# Patient Record
Sex: Female | Born: 1950
Health system: Southern US, Community
[De-identification: ages and names within clinical notes are randomized; demographics above are authoritative.]

## PROBLEM LIST (undated history)

## (undated) DIAGNOSIS — I10 Essential (primary) hypertension: Secondary | ICD-10-CM

## (undated) HISTORY — PX: GALLBLADDER SURGERY: SHX652

## (undated) HISTORY — PX: BREAST EXCISIONAL BIOPSY: SUR124

## (undated) HISTORY — DX: Essential (primary) hypertension: I10

## (undated) HISTORY — PX: HERNIA REPAIR: SHX51

## (undated) HISTORY — PX: ABDOMINAL HYSTERECTOMY: SHX81

## (undated) HISTORY — PX: TUMOR REMOVAL: SHX12

---

## 2000-03-31 ENCOUNTER — Other Ambulatory Visit: Admission: RE | Admit: 2000-03-31 | Discharge: 2000-03-31 | Payer: Self-pay | Admitting: Gynecology

## 2002-08-29 ENCOUNTER — Other Ambulatory Visit: Admission: RE | Admit: 2002-08-29 | Discharge: 2002-08-29 | Payer: Self-pay | Admitting: Family Medicine

## 2002-11-03 ENCOUNTER — Encounter: Payer: Self-pay | Admitting: Gastroenterology

## 2002-11-03 ENCOUNTER — Emergency Department (HOSPITAL_COMMUNITY): Admission: EM | Admit: 2002-11-03 | Discharge: 2002-11-03 | Payer: Self-pay | Admitting: Emergency Medicine

## 2002-11-03 ENCOUNTER — Ambulatory Visit (HOSPITAL_COMMUNITY): Admission: RE | Admit: 2002-11-03 | Discharge: 2002-11-03 | Payer: Self-pay | Admitting: Family Medicine

## 2002-11-08 ENCOUNTER — Encounter (INDEPENDENT_AMBULATORY_CARE_PROVIDER_SITE_OTHER): Payer: Self-pay | Admitting: *Deleted

## 2002-11-08 ENCOUNTER — Observation Stay (HOSPITAL_COMMUNITY): Admission: RE | Admit: 2002-11-08 | Discharge: 2002-11-09 | Payer: Self-pay | Admitting: *Deleted

## 2004-04-29 ENCOUNTER — Encounter: Admission: RE | Admit: 2004-04-29 | Discharge: 2004-04-29 | Payer: Self-pay | Admitting: Family Medicine

## 2004-05-23 ENCOUNTER — Encounter: Admission: RE | Admit: 2004-05-23 | Discharge: 2004-05-23 | Payer: Self-pay | Admitting: Family Medicine

## 2004-05-23 ENCOUNTER — Encounter (INDEPENDENT_AMBULATORY_CARE_PROVIDER_SITE_OTHER): Payer: Self-pay | Admitting: *Deleted

## 2004-06-05 ENCOUNTER — Other Ambulatory Visit: Admission: RE | Admit: 2004-06-05 | Discharge: 2004-06-05 | Payer: Self-pay | Admitting: Family Medicine

## 2004-08-05 ENCOUNTER — Ambulatory Visit (HOSPITAL_COMMUNITY): Admission: RE | Admit: 2004-08-05 | Discharge: 2004-08-05 | Payer: Self-pay | Admitting: Gastroenterology

## 2004-10-19 ENCOUNTER — Emergency Department (HOSPITAL_COMMUNITY): Admission: EM | Admit: 2004-10-19 | Discharge: 2004-10-19 | Payer: Self-pay

## 2005-01-26 ENCOUNTER — Encounter: Admission: RE | Admit: 2005-01-26 | Discharge: 2005-01-26 | Payer: Self-pay | Admitting: Family Medicine

## 2005-05-28 ENCOUNTER — Encounter: Admission: RE | Admit: 2005-05-28 | Discharge: 2005-05-28 | Payer: Self-pay | Admitting: Family Medicine

## 2005-06-04 ENCOUNTER — Encounter: Admission: RE | Admit: 2005-06-04 | Discharge: 2005-06-04 | Payer: Self-pay | Admitting: Family Medicine

## 2005-06-12 ENCOUNTER — Other Ambulatory Visit: Admission: RE | Admit: 2005-06-12 | Discharge: 2005-06-12 | Payer: Self-pay | Admitting: Family Medicine

## 2005-06-13 ENCOUNTER — Emergency Department (HOSPITAL_COMMUNITY): Admission: EM | Admit: 2005-06-13 | Discharge: 2005-06-13 | Payer: Self-pay | Admitting: Emergency Medicine

## 2005-12-25 ENCOUNTER — Encounter: Admission: RE | Admit: 2005-12-25 | Discharge: 2005-12-25 | Payer: Self-pay | Admitting: Family Medicine

## 2006-04-22 ENCOUNTER — Encounter: Admission: RE | Admit: 2006-04-22 | Discharge: 2006-04-22 | Payer: Self-pay | Admitting: Family Medicine

## 2006-05-11 ENCOUNTER — Ambulatory Visit (HOSPITAL_COMMUNITY): Admission: RE | Admit: 2006-05-11 | Discharge: 2006-05-11 | Payer: Self-pay | Admitting: *Deleted

## 2006-05-11 ENCOUNTER — Encounter (INDEPENDENT_AMBULATORY_CARE_PROVIDER_SITE_OTHER): Payer: Self-pay | Admitting: Specialist

## 2006-06-18 ENCOUNTER — Emergency Department (HOSPITAL_COMMUNITY): Admission: EM | Admit: 2006-06-18 | Discharge: 2006-06-18 | Payer: Self-pay | Admitting: Emergency Medicine

## 2006-06-22 ENCOUNTER — Ambulatory Visit (HOSPITAL_COMMUNITY): Admission: RE | Admit: 2006-06-22 | Discharge: 2006-06-22 | Payer: Self-pay | Admitting: Family Medicine

## 2006-06-25 ENCOUNTER — Ambulatory Visit (HOSPITAL_COMMUNITY): Admission: RE | Admit: 2006-06-25 | Discharge: 2006-06-25 | Payer: Self-pay | Admitting: Family Medicine

## 2006-06-29 ENCOUNTER — Ambulatory Visit (HOSPITAL_COMMUNITY): Admission: RE | Admit: 2006-06-29 | Discharge: 2006-06-29 | Payer: Self-pay | Admitting: Gastroenterology

## 2006-06-30 ENCOUNTER — Ambulatory Visit (HOSPITAL_COMMUNITY): Admission: RE | Admit: 2006-06-30 | Discharge: 2006-06-30 | Payer: Self-pay | Admitting: Gastroenterology

## 2007-01-27 ENCOUNTER — Encounter: Admission: RE | Admit: 2007-01-27 | Discharge: 2007-01-27 | Payer: Self-pay | Admitting: Family Medicine

## 2008-05-22 ENCOUNTER — Encounter: Admission: RE | Admit: 2008-05-22 | Discharge: 2008-05-22 | Payer: Self-pay | Admitting: Family Medicine

## 2009-05-24 ENCOUNTER — Encounter: Admission: RE | Admit: 2009-05-24 | Discharge: 2009-05-24 | Payer: Self-pay | Admitting: Family Medicine

## 2010-11-30 ENCOUNTER — Encounter: Payer: Self-pay | Admitting: Family Medicine

## 2011-03-27 NOTE — Op Note (Signed)
NAME:  BRIGET, SHAHEED                         ACCOUNT NO.:  [id number]   MEDICAL RECORD NO.:  [id number]                   PATIENT TYPE:  OBV   LOCATION:  0480                                 FACILITY:  [facility]   PHYSICIAN:  Vikki Ports, M.D.         DATE OF BIRTH:  1951-07-04   DATE OF PROCEDURE:  11/08/2002  DATE OF DISCHARGE:  11/09/2002                                 OPERATIVE REPORT   PREOPERATIVE DIAGNOSIS:  Symptomatic cholelithiasis.   POSTOPERATIVE DIAGNOSIS:  Symptomatic cholelithiasis with normal  cholangiogram.   PROCEDURE:  Laparoscopic cholecystectomy with intraoperative cholangiogram.   SURGEON:  Vikki Ports, M.D.   ANESTHESIA:  General.   DESCRIPTION OF PROCEDURE:  The patient was taken to the operating room and  placed in the supine position.  After adequate general anesthesia was  induced using the endotracheal tube, the abdomen was prepped and draped in  the normal sterile fashion.  Using a transverse infraumbilical incision, I  dissected down to the fascia.  The fascia was opened vertically.  An O  Vicryl pursestring suture was placed around the fascial defect.  The Hasson  trocar was introduced into the abdomen and using continuous flow carbon  dioxide, the abdomen was insufflated to a pressure of 15 mmHg.  Under direct  visualization, a 10 mm port was placed in the subxiphoid region, and two 5  mm ports were placed in the right abdomen.  The gallbladder was identified  and retracted cephalad.  The gallbladder was very, very edematous and was  difficult to grasp and therefore a cyst aspirator was introduced, and the  gallbladder was aspirated.  The infundibulum was identified and a tubular  structure from the infundibulum was identified and felt to be the cystic  duct.  Good window was created in all directions, and the duct was clipped  proximally at the gallbladder.  There appeared to be a very short cystic  duct and cholangiogram  showed a normal filling of the duodenum and hepatic  ducts and again a very short cystic duct.  The cholangiocatheter was  removed, and the duct was clipped distally with two clips and divided.  The  cystic artery was then identified, triply clipped and divided.  The  gallbladder was taken off the gallbladder bed using Bovie.  This was a  difficult dissection secondary to the extensive inflammation.  The  gallbladder was then placed in the EndoCatch bag and removed through the  umbilical port.  Adequate hemostasis was ensured, and the skin was closed  with a subcuticular 4-0 Monocryl after all trocars had been removed.  The  fascial defect had been closed with an 0 Vicryl pursestring suture.  Vikki Ports, M.D.    KRH/MEDQ  D:  11/12/2002  T:  11/12/2002  Job:  [id number]

## 2011-03-27 NOTE — Op Note (Signed)
Krista Gonzales, Krista Gonzales               ACCOUNT NO.:  [id number]   MEDICAL RECORD NO.:  [id number]          PATIENT TYPE:  AMB   LOCATION:  ENDO                         FACILITY:  [facility]   PHYSICIAN:  John C. Madilyn Fireman, M.D.    DATE OF BIRTH:  11/23/1950   DATE OF PROCEDURE:  08/05/2004  DATE OF DISCHARGE:                                 OPERATIVE REPORT   PROCEDURE:  Colonoscopy.   INDICATIONS FOR PROCEDURE:  Family history of colon cancer in a first-degree  relative.   PROCEDURE:  The patient was placed in the left lateral decubitus position  and placed on the pulse monitor with continuous low flow oxygen delivered by  nasal cannula.  She was sedated with 75 mcg IV fentanyl and 7 mg IV Versed.  The Olympus video colonoscope is inserted into the rectum and advanced to  the cecum, confirmed by transillumination at McBurney's point and  visualization of the ileocecal valve and appendiceal orifice.  Prep is  excellent.  The cecum, ascending, transverse, descending, and sigmoid colon  all appeared normal with no masses, polyps, diverticula, or other mucosal  abnormalities.  The rectum likewise appeared normal, and retroflexed view of  the anus revealed no obvious internal hemorrhoids.  The scope was then  withdrawn, and the patient returned to the recovery room in stable  condition.  She tolerated the procedure well, and there were no immediate  complications.   IMPRESSION:  Normal colonoscopy.   PLAN:  Repeat study in five years.      JCH/MEDQ  D:  08/05/2004  T:  08/05/2004  Job:  [id number]   cc:   Magnus Sinning. Dimple Casey, M.D.  [address] [place]  [place] [address]  Fax: [phone]

## 2011-03-27 NOTE — Op Note (Signed)
Krista Gonzales, Krista Gonzales               ACCOUNT NO.:  [id number]   MEDICAL RECORD NO.:  [id number]          PATIENT TYPE:  AMB   LOCATION:  SDS                          FACILITY:  [facility]   PHYSICIAN:  Alfonse Ras, MD   DATE OF BIRTH:  1951/01/15   DATE OF PROCEDURE:  05/11/2006  DATE OF DISCHARGE:                                 OPERATIVE REPORT   PREOPERATIVE DIAGNOSES:  1.  Right breast mass.  2.  Umbilical hernia.   POSTOPERATIVE DIAGNOSES:  1.  Right breast mass.  2.  Umbilical hernia.   PROCEDURE:  1.  Excisional right breast biopsy.  2.  Umbilical hernia repair.   ANESTHESIA:  General laryngeal mask.   SURGEON:  Baruch Merl, M.D.   DESCRIPTION:  The patient was taken operating room and after general  anesthesia was induced using laryngeal mask, the abdomen and right breast  were prepped and draped normal sterile fashion.  Using a transverse  previously placed incision infraumbilically, I dissected down onto a hernia  sac.  Omental contents were reduced into the abdominal cavity.  Fascial  defect was only about 1.5 cm and was closed primarily with figure-of-eight 0  Novofil sutures.  The skin was closed with staples.  Tissues were injected  with Marcaine.   Then turned my attention to the right breast.  A transverse incision was  made over 12 o'clock region of the right breast over the palpable mass.  I  dissected down through fatty breast tissue onto a multilobulated mass.  This  was grasped with a 2-0 Vicryl suture and delivered up off the pectoralis  fascia in its entirety.  It was sent for pathologic evaluation.  The skin  was closed with subcuticular 3-0 Monocryl.  Steri-Strips and dressings were  applied.  The patient tolerated the procedure well, went to PACU in good  condition.      Alfonse Ras, MD  Electronically Signed     KRE/MEDQ  D:  05/11/2006  T:  05/11/2006  Job:  [id number]

## 2012-02-24 ENCOUNTER — Other Ambulatory Visit: Payer: Self-pay | Admitting: Family Medicine

## 2012-02-24 DIAGNOSIS — Z1231 Encounter for screening mammogram for malignant neoplasm of breast: Secondary | ICD-10-CM

## 2012-03-29 ENCOUNTER — Ambulatory Visit (HOSPITAL_COMMUNITY)
Admission: RE | Admit: 2012-03-29 | Discharge: 2012-03-29 | Disposition: A | Discharge status: Home / Self Care | Payer: Self-pay | Source: Ambulatory Visit | Attending: Family Medicine | Admitting: Family Medicine

## 2012-03-29 DIAGNOSIS — Z1231 Encounter for screening mammogram for malignant neoplasm of breast: Secondary | ICD-10-CM

## 2013-05-20 ENCOUNTER — Other Ambulatory Visit: Payer: Self-pay | Admitting: *Deleted

## 2013-05-20 ENCOUNTER — Other Ambulatory Visit: Payer: Self-pay | Admitting: Nurse Practitioner

## 2013-05-20 MED ORDER — LISINOPRIL 20 MG PO TABS: 20.0000 mg | ORAL_TABLET | Freq: Every day | ORAL | Status: DC

## 2013-05-20 NOTE — Telephone Encounter (Signed)
Patient aware rx sent to pharmacy.  

## 2013-07-24 ENCOUNTER — Telehealth: Payer: Self-pay | Admitting: Nurse Practitioner

## 2013-07-24 ENCOUNTER — Encounter: Payer: Self-pay | Admitting: Family Medicine

## 2013-07-24 ENCOUNTER — Ambulatory Visit (INDEPENDENT_AMBULATORY_CARE_PROVIDER_SITE_OTHER): Payer: BC Managed Care – PPO | Admitting: Family Medicine

## 2013-07-24 VITALS — BP 171/94 | HR 70 | Temp 98.0°F | Ht 66.0 in | Wt 185.2 lb

## 2013-07-24 DIAGNOSIS — B370 Candidal stomatitis: Secondary | ICD-10-CM

## 2013-07-24 DIAGNOSIS — I10 Essential (primary) hypertension: Secondary | ICD-10-CM | POA: Insufficient documentation

## 2013-07-24 LAB — POCT CBC
Granulocyte percent: 64.2 %G (ref 37–80)
HCT, POC: 39 % (ref 37.7–47.9)
Hemoglobin: 12.8 g/dL (ref 12.2–16.2)
Lymph, poc: 2 (ref 0.6–3.4)
MCH, POC: 27.7 pg (ref 27–31.2)
MCHC: 32.7 g/dL (ref 31.8–35.4)
MCV: 84.7 fL (ref 80–97)
MPV: 7.6 fL (ref 0–99.8)
POC Granulocyte: 4.4 (ref 2–6.9)
POC LYMPH PERCENT: 29.2 %L (ref 10–50)
Platelet Count, POC: 250 10*3/uL (ref 142–424)
RBC: 4.6 M/uL (ref 4.04–5.48)
RDW, POC: 13.9 %
WBC: 6.8 10*3/uL (ref 4.6–10.2)

## 2013-07-24 MED ORDER — AMLODIPINE BESYLATE 5 MG PO TABS: 5.0000 mg | ORAL_TABLET | Freq: Every day | ORAL | Status: DC

## 2013-07-24 MED ORDER — CLOTRIMAZOLE 10 MG MT TROC: 10.0000 mg | Freq: Every day | OROMUCOSAL | Status: DC

## 2013-07-24 NOTE — Telephone Encounter (Signed)
Mouth is covered in yellow/white coating and she has lost her sense of taste. Noticed it about 2 days ago. Appt scheduled for this afternoon.  Patient aware.

## 2013-07-24 NOTE — Progress Notes (Signed)
Patient ID: Krista Gonzales, female   DOB: [birth year], 62 y.o.   MRN: [id number] SUBJECTIVE: CC: Chief Complaint  Patient presents with  . Acute Visit    thrush  and c/o headache     HPI: Patient is here for follow up of hypertension: denies Headache;deniesChest Pain;denies weakness;denies Shortness of Breath or Orthopnea;denies Visual changes;denies palpitations;denies cough;denies pedal edema;denies symptoms of TIA or stroke; admits to Compliance with medications. denies Problems with medications. BP is elevated and she is having headaches.no neurologic deficit.  Thrush in the mouth. Has dentures in. Tongue sore but the white irritated areas are in the cheeks.   No past medical history on file. No past surgical history on file. History   Social History  . Marital Status: Married    Spouse Name: N/A    Number of Children: N/A  . Years of Education: N/A   Occupational History  . Not on file.   Social History Main Topics  . Smoking status: Never Smoker   . Smokeless tobacco: Not on file  . Alcohol Use: Not on file  . Drug Use: Not on file  . Sexual Activity: Not on file   Other Topics Concern  . Not on file   Social History Narrative  . No narrative on file   No family history on file. Current Outpatient Prescriptions on File Prior to Visit  Medication Sig Dispense Refill  . lisinopril (PRINIVIL,ZESTRIL) 20 MG tablet Take 1 tablet (20 mg total) by mouth daily.  90 tablet  3   No current facility-administered medications on file prior to visit.   No Known Allergies  There is no immunization history on file for this patient. Prior to Admission medications   Medication Sig Start Date End Date Taking? Authorizing Provider  lisinopril (PRINIVIL,ZESTRIL) 20 MG tablet Take 1 tablet (20 mg total) by mouth daily. 05/20/13  Yes Mary-Margaret Daphine Deutscher, FNP     ROS: As above in the HPI. All other systems are stable or negative.  OBJECTIVE: APPEARANCE:  Patient in no  acute distress.The patient appeared well nourished and normally developed. Acyanotic. Waist: VITAL SIGNS:BP 171/94  Pulse 70  Temp(Src) 98 F (36.7 C) (Oral)  Ht 5\' 6"  (1.676 m)  Wt 185 lb 3.2 oz (84.006 kg)  BMI 29.91 kg/m2 WF  SKIN: warm and  Dry without overt rashes, tattoos and scars  HEAD and Neck: without JVD, Head and scalp: normal Eyes:No scleral icterus. Fundi normal, eye movements normal. Ears: Auricle normal, canal normal, Tympanic membranes normal, insufflation normal. Nose: normal Throat: normal Oral cavity. Upper denture. Has white thick curdlike materila in the cheeks adjacent to the dentures with inflammation of the tongue and the tongue is coated. Neck & thyroid: normal  CHEST & LUNGS: Chest wall: normal Lungs: Clear  CVS: Reveals the PMI to be normally located. Regular rhythm, First and Second Heart sounds are normal,  absence of murmurs, rubs or gallops. Peripheral vasculature: Radial pulses: normal Dorsal pedis pulses: normal Posterior pulses: normal  ABDOMEN:  Appearance: normal Benign, no organomegaly, no masses, no Abdominal Aortic enlargement. No Guarding , no rebound. No Bruits. Bowel sounds: normal  RECTAL: N/A GU: N/A  EXTREMETIES: nonedematous.  MUSCULOSKELETAL:  Spine: normal Joints: intact  NEUROLOGIC: oriented to time,place and person; nonfocal. Strength is normal Sensory is normal Reflexes are normal Cranial Nerves are normal.  ASSESSMENT: Hypertension - Plan: amLODipine (NORVASC) 5 MG tablet, CMP14+EGFR  Thrush, oral - Plan: clotrimazole (MYCELEX) 10 MG troche, POCT CBC   PLAN: Orders  Placed This Encounter  Procedures  . CMP14+EGFR  . POCT CBC    Meds ordered this encounter  Medications  . amLODipine (NORVASC) 5 MG tablet    Sig: Take 1 tablet (5 mg total) by mouth daily.    Dispense:  30 tablet    Refill:  3  . clotrimazole (MYCELEX) 10 MG troche    Sig: Take 1 tablet (10 mg total) by mouth 5 (five) times  daily.    Dispense:  70 tablet    Refill:  0    Low salt diet.  Return in about 2 weeks (around 08/07/2013) for recheck BP, Recheck medical problems. Or before if any acute changes.  Tyera Hansley P. [place]esto Charon, M.D.

## 2013-07-26 LAB — CMP14+EGFR
ALT: 10 IU/L (ref 0–32)
AST: 15 IU/L (ref 0–40)
Albumin/Globulin Ratio: 1.9 (ref 1.1–2.5)
Albumin: 4.4 g/dL (ref 3.5–5.5)
Alkaline Phosphatase: 74 IU/L (ref 39–117)
BUN/Creatinine Ratio: 28 — ABNORMAL HIGH (ref 9–23)
BUN: 14 mg/dL (ref 6–24)
CO2: 26 mmol/L (ref 18–29)
Calcium: 9.5 mg/dL (ref 8.7–10.2)
Chloride: 102 mmol/L (ref 97–108)
Creatinine, Ser: 0.5 mg/dL — ABNORMAL LOW (ref 0.57–1.00)
GFR calc Af Amer: 123 mL/min/{1.73_m2} (ref >59)
GFR calc non Af Amer: 106 mL/min/{1.73_m2} (ref >59)
Globulin, Total: 2.3 g/dL (ref 1.5–4.5)
Glucose: 85 mg/dL (ref 65–99)
Potassium: 3.9 mmol/L (ref 3.5–5.2)
Sodium: 142 mmol/L (ref 134–144)
Total Bilirubin: 0.4 mg/dL (ref 0.0–1.2)
Total Protein: 6.7 g/dL (ref 6.0–8.5)

## 2013-07-26 NOTE — Progress Notes (Signed)
Quick Note:  Call patient. Labs normal. No change in plan. ______ 

## 2013-08-07 ENCOUNTER — Ambulatory Visit: Payer: BC Managed Care – PPO | Admitting: Family Medicine

## 2013-08-22 ENCOUNTER — Ambulatory Visit (INDEPENDENT_AMBULATORY_CARE_PROVIDER_SITE_OTHER): Payer: BC Managed Care – PPO | Admitting: Nurse Practitioner

## 2013-08-22 ENCOUNTER — Encounter: Payer: Self-pay | Admitting: Nurse Practitioner

## 2013-08-22 ENCOUNTER — Other Ambulatory Visit: Payer: Self-pay | Admitting: Nurse Practitioner

## 2013-08-22 VITALS — BP 151/85 | HR 65 | Temp 98.5°F | Ht 64.0 in | Wt 180.0 lb

## 2013-08-22 DIAGNOSIS — Z01419 Encounter for gynecological examination (general) (routine) without abnormal findings: Secondary | ICD-10-CM

## 2013-08-22 DIAGNOSIS — Z23 Encounter for immunization: Secondary | ICD-10-CM

## 2013-08-22 DIAGNOSIS — Z0000 Encounter for general adult medical examination without abnormal findings: Secondary | ICD-10-CM

## 2013-08-22 DIAGNOSIS — Z124 Encounter for screening for malignant neoplasm of cervix: Secondary | ICD-10-CM

## 2013-08-22 DIAGNOSIS — I10 Essential (primary) hypertension: Secondary | ICD-10-CM

## 2013-08-22 LAB — POCT UA - MICROSCOPIC ONLY
Casts, Ur, LPF, POC: NEGATIVE
Crystals, Ur, HPF, POC: NEGATIVE
Yeast, UA: NEGATIVE

## 2013-08-22 LAB — POCT URINALYSIS DIPSTICK
Ketones, UA: NEGATIVE
Leukocytes, UA: NEGATIVE
Nitrite, UA: NEGATIVE
Urobilinogen, UA: NEGATIVE
pH, UA: 7.5

## 2013-08-22 LAB — POCT CBC
HCT, POC: 38 % (ref 37.7–47.9)
Hemoglobin: 12.7 g/dL (ref 12.2–16.2)
MCHC: 33.5 g/dL (ref 31.8–35.4)
MCV: 84.7 fL (ref 80–97)
POC Granulocyte: 4.1 (ref 2–6.9)
WBC: 5.9 10*3/uL (ref 4.6–10.2)

## 2013-08-22 NOTE — Progress Notes (Signed)
  Subjective:    Patient ID: Krista Gonzales, female    DOB: [birth year], 62 y.o.   MRN: [id number]  HPI Patient in today for CPE and PAP- She is doing well with no complaints. Patient Active Problem List   Diagnosis Date Noted  . Hypertension    Outpatient Encounter Prescriptions as of 08/22/2013  Medication Sig Dispense Refill  . amLODipine (NORVASC) 5 MG tablet Take 1 tablet (5 mg total) by mouth daily.  30 tablet  3  . lisinopril (PRINIVIL,ZESTRIL) 20 MG tablet Take 1 tablet (20 mg total) by mouth daily.  90 tablet  3  . [DISCONTINUED] clotrimazole (MYCELEX) 10 MG troche Take 1 tablet (10 mg total) by mouth 5 (five) times daily.  70 tablet  0   No facility-administered encounter medications on file as of 08/22/2013.       Review of Systems  Constitutional: Negative.   HENT: Negative.   Respiratory: Negative.   Cardiovascular: Negative.   Gastrointestinal: Negative.   Genitourinary: Negative.   Musculoskeletal: Negative.   Psychiatric/Behavioral: Negative.        Objective:   Physical Exam  Constitutional: She is oriented to person, place, and time. She appears well-developed and well-nourished.  HENT:  Head: Normocephalic.  Right Ear: Hearing, tympanic membrane, external ear and ear canal normal.  Left Ear: Hearing, tympanic membrane, external ear and ear canal normal.  Nose: Nose normal.  Mouth/Throat: Uvula is midline and oropharynx is clear and moist.  Eyes: Conjunctivae and EOM are normal. Pupils are equal, round, and reactive to light.  Neck: Normal range of motion and full passive range of motion without pain. Neck supple. No JVD present. Carotid bruit is not present. No mass and no thyromegaly present.  Cardiovascular: Normal rate, normal heart sounds and intact distal pulses.   No murmur heard. Multiple lower ext varicosities  Pulmonary/Chest: Effort normal and breath sounds normal. Right breast exhibits no inverted nipple, no mass, no nipple discharge, no skin  change and no tenderness. Left breast exhibits no inverted nipple, no mass, no nipple discharge, no skin change and no tenderness.  Abdominal: Soft. Bowel sounds are normal. She exhibits no mass. There is no tenderness.  Genitourinary: Vagina normal and uterus normal. No breast swelling, tenderness, discharge or bleeding.  bimanual exam-No adnexal masses or tenderness. vaginal cuff intact  Musculoskeletal: Normal range of motion.  Lymphadenopathy:    She has no cervical adenopathy.  Neurological: She is alert and oriented to person, place, and time.  Skin: Skin is warm and dry.  Psychiatric: She has a normal mood and affect. Her behavior is normal. Judgment and thought content normal.    BP 151/85  Pulse 65  Temp(Src) 98.5 F (36.9 C) (Oral)  Ht 5\' 4"  (1.626 m)  Wt 180 lb (81.647 kg)  BMI 30.88 kg/m2       Assessment & Plan:   1. Encounter for routine gynecological examination   2. Annual physical exam   3. Hypertension    Orders Placed This Encounter  Procedures  . CMP14+EGFR  . NMR, lipoprofile  . Thyroid Panel With TSH  . POCT UA - Microscopic Only  . POCT urinalysis dipstick  . POCT CBC   Diet and exercise discussed Labs pending Tetanus booster today Follow up in 6 months Mary-Margaret Daphine Deutscher, FNP

## 2013-08-22 NOTE — Addendum Note (Signed)
Addended by: Baltazar Apo on: 08/22/2013 03:57 PM   Modules accepted: Orders

## 2013-08-22 NOTE — Patient Instructions (Signed)

## 2013-08-23 LAB — CMP14+EGFR
Albumin/Globulin Ratio: 1.9 (ref 1.1–2.5)
Albumin: 4.4 g/dL (ref 3.5–5.5)
Alkaline Phosphatase: 78 IU/L (ref 39–117)
BUN/Creatinine Ratio: 24 — ABNORMAL HIGH (ref 9–23)
GFR calc Af Amer: 114 mL/min/{1.73_m2} (ref >59)
GFR calc non Af Amer: 99 mL/min/{1.73_m2} (ref >59)
Glucose: 84 mg/dL (ref 65–99)
Potassium: 4.4 mmol/L (ref 3.5–5.2)
Total Bilirubin: 0.4 mg/dL (ref 0.0–1.2)
Total Protein: 6.7 g/dL (ref 6.0–8.5)

## 2013-08-23 LAB — NMR, LIPOPROFILE
Cholesterol: 174 mg/dL (ref <200)
LDL Size: 21.2 nm (ref >20.5)
LP-IR Score: 35 (ref <=45)
Small LDL Particle Number: 584 nmol/L — ABNORMAL HIGH (ref <=527)
Triglycerides by NMR: 79 mg/dL (ref <150)

## 2013-08-25 LAB — PAP IG W/ RFLX HPV ASCU: PAP Smear Comment: 0

## 2013-11-27 ENCOUNTER — Other Ambulatory Visit: Payer: Self-pay | Admitting: Nurse Practitioner

## 2013-11-28 NOTE — Telephone Encounter (Signed)
Last seen 08/22/13  MMM This med not on EPIC list

## 2014-04-14 ENCOUNTER — Other Ambulatory Visit: Payer: Self-pay | Admitting: Nurse Practitioner

## 2014-05-02 ENCOUNTER — Ambulatory Visit (INDEPENDENT_AMBULATORY_CARE_PROVIDER_SITE_OTHER): Payer: 59 | Admitting: Nurse Practitioner

## 2014-05-02 ENCOUNTER — Encounter: Payer: Self-pay | Admitting: Nurse Practitioner

## 2014-05-02 VITALS — BP 118/62 | HR 76 | Temp 98.2°F | Ht 64.0 in | Wt 192.0 lb

## 2014-05-02 DIAGNOSIS — I10 Essential (primary) hypertension: Secondary | ICD-10-CM

## 2014-05-02 MED ORDER — AMLODIPINE BESYLATE 5 MG PO TABS: 5.0000 mg | ORAL_TABLET | Freq: Every day | ORAL | Status: DC

## 2014-05-02 MED ORDER — LISINOPRIL 20 MG PO TABS
20.0000 mg | ORAL_TABLET | Freq: Every day | ORAL | Status: DC
Start: 2014-05-02 — End: 2014-12-19

## 2014-05-02 MED ORDER — FUROSEMIDE 40 MG PO TABS: ORAL_TABLET | ORAL | Status: DC

## 2014-05-02 NOTE — Patient Instructions (Signed)

## 2014-05-02 NOTE — Progress Notes (Signed)
   Subjective:    Patient ID: Krista Gonzales, female    DOB: 1951/11/09, 63 y.o.   MRN: [id number]  Patient here today for follow up of chronic medical problems.   Hypertension This is a chronic problem. The current episode started more than 1 year ago. The problem has been resolved since onset. The problem is controlled. Pertinent negatives include no blurred vision, headaches, neck pain, palpitations, PND or sweats. There are no associated agents to hypertension. Risk factors for coronary artery disease include post-menopausal state and sedentary lifestyle. Past treatments include calcium channel blockers, ACE inhibitors and diuretics. The current treatment provides significant improvement. Compliance problems include diet and exercise.       Review of Systems  Eyes: Negative for blurred vision.  Cardiovascular: Negative for palpitations and PND.  Musculoskeletal: Negative for neck pain.  Neurological: Negative for headaches.       Objective:   Physical Exam  Constitutional: She is oriented to person, place, and time. She appears well-developed and well-nourished.  HENT:  Nose: Nose normal.  Mouth/Throat: Oropharynx is clear and moist.  Eyes: EOM are normal.  Neck: Trachea normal, normal range of motion and full passive range of motion without pain. Neck supple. No JVD present. Carotid bruit is not present. No thyromegaly present.  Cardiovascular: Normal rate, regular rhythm, normal heart sounds and intact distal pulses.  Exam reveals no gallop and no friction rub.   No murmur heard. Pulmonary/Chest: Effort normal and breath sounds normal.  Abdominal: Soft. Bowel sounds are normal. She exhibits no distension and no mass. There is no tenderness.  Musculoskeletal: Normal range of motion.  Lymphadenopathy:    She has no cervical adenopathy.  Neurological: She is alert and oriented to person, place, and time. She has normal reflexes.  Skin: Skin is warm and dry.  Psychiatric: She  has a normal mood and affect. Her behavior is normal. Judgment and thought content normal.  BP 118/62  Pulse 76  Temp(Src) 98.2 F (36.8 C) (Oral)  Ht $R'5\' 4"'WU$  (1.626 m)  Wt 192 lb (87.091 kg)  BMI 32.94 kg/m2         Assessment & Plan:   1. Essential hypertension    Orders Placed This Encounter  Procedures  . CMP14+EGFR  . NMR, lipoprofile   Meds ordered this encounter  Medications  . amLODipine (NORVASC) 5 MG tablet    Sig: Take 1 tablet (5 mg total) by mouth daily.    Dispense:  90 tablet    Refill:  1    Order Specific Question:  Supervising Shealynn Saulnier    Answer:  Chipper Herb [[id number]]  . furosemide (LASIX) 40 MG tablet    Sig: TAKE ONE TABLET BY MOUTH ONCE DAILY    Dispense:  90 tablet    Refill:  1    Order Specific Question:  Supervising Izola Teague    Answer:  Chipper Herb [[id number]]  . lisinopril (PRINIVIL,ZESTRIL) 20 MG tablet    Sig: Take 1 tablet (20 mg total) by mouth daily.    Dispense:  90 tablet    Refill:  1    Order Specific Question:  Supervising Imonie Tuch    Answer:  Chipper Herb [[id number]]    Labs pending Health maintenance reviewed Diet and exercise encouraged Continue all meds Follow up  In 3 months   [place], FNP

## 2014-05-03 LAB — CMP14+EGFR
ALK PHOS: 73 IU/L (ref 39–117)
ALT: 13 IU/L (ref 0–32)
AST: 17 IU/L (ref 0–40)
Albumin/Globulin Ratio: 1.9 (ref 1.1–2.5)
Albumin: 4.2 g/dL (ref 3.6–4.8)
BILIRUBIN TOTAL: 0.3 mg/dL (ref 0.0–1.2)
BUN / CREAT RATIO: 21 (ref 11–26)
BUN: 13 mg/dL (ref 8–27)
CHLORIDE: 104 mmol/L (ref 97–108)
CO2: 22 mmol/L (ref 18–29)
Calcium: 9.1 mg/dL (ref 8.7–10.3)
Creatinine, Ser: 0.61 mg/dL (ref 0.57–1.00)
GFR calc non Af Amer: 99 mL/min/{1.73_m2} (ref >59)
GFR, EST AFRICAN AMERICAN: 114 mL/min/{1.73_m2} (ref >59)
Globulin, Total: 2.2 g/dL (ref 1.5–4.5)
Glucose: 90 mg/dL (ref 65–99)
POTASSIUM: 4.4 mmol/L (ref 3.5–5.2)
Sodium: 143 mmol/L (ref 134–144)
Total Protein: 6.4 g/dL (ref 6.0–8.5)

## 2014-05-03 LAB — NMR, LIPOPROFILE
Cholesterol: 178 mg/dL (ref 100–199)
HDL Cholesterol by NMR: 57 mg/dL (ref >39)
HDL Particle Number: 40 umol/L (ref >=30.5)
LDL PARTICLE NUMBER: 1031 nmol/L — AB (ref <1000)
LDL SIZE: 21.6 nm (ref >20.5)
LDLC SERPL CALC-MCNC: 100 mg/dL — AB (ref 0–99)
LP-IR SCORE: 26 (ref <=45)
Small LDL Particle Number: <90 nmol/L (ref <=527)
Triglycerides by NMR: 105 mg/dL (ref 0–149)

## 2014-05-21 ENCOUNTER — Ambulatory Visit (INDEPENDENT_AMBULATORY_CARE_PROVIDER_SITE_OTHER): Payer: 59 | Admitting: Family Medicine

## 2014-05-21 ENCOUNTER — Telehealth: Payer: Self-pay | Admitting: Family Medicine

## 2014-05-21 ENCOUNTER — Encounter: Payer: Self-pay | Admitting: Family Medicine

## 2014-05-21 VITALS — BP 117/66 | HR 76 | Temp 98.6°F | Ht 64.0 in | Wt 188.6 lb

## 2014-05-21 DIAGNOSIS — R350 Frequency of micturition: Secondary | ICD-10-CM

## 2014-05-21 DIAGNOSIS — N390 Urinary tract infection, site not specified: Secondary | ICD-10-CM

## 2014-05-21 LAB — POCT UA - MICROSCOPIC ONLY
Casts, Ur, LPF, POC: NEGATIVE
Crystals, Ur, HPF, POC: NEGATIVE
Mucus, UA: NEGATIVE
Yeast, UA: NEGATIVE

## 2014-05-21 LAB — POCT URINALYSIS DIPSTICK
Bilirubin, UA: NEGATIVE
Blood, UA: NEGATIVE
Glucose, UA: NEGATIVE
Ketones, UA: NEGATIVE
Nitrite, UA: NEGATIVE
Protein, UA: NEGATIVE
Spec Grav, UA: 1.01
Urobilinogen, UA: NEGATIVE
pH, UA: 5

## 2014-05-21 MED ORDER — CIPROFLOXACIN HCL 500 MG PO TABS: 500.0000 mg | ORAL_TABLET | Freq: Two times a day (BID) | ORAL | Status: DC

## 2014-05-21 NOTE — Telephone Encounter (Signed)
appt scheduled for 5:30 with [place]

## 2014-05-21 NOTE — Progress Notes (Signed)
   Subjective:    Patient ID: Krista Gonzales, female    DOB: October 19, 1951, 63 y.o.   MRN: [id number]  HPI C/o urinary freq, dysuria, and fatigue for over a week.   Review of Systems No chest pain, SOB, HA, dizziness, vision change, N/V, diarrhea, constipation, dysuria, urinary urgency or frequency, myalgias, arthralgias or rash.     Objective:   Physical Exam  Vital signs noted  Well developed well nourished female.  HEENT - Head atraumatic Normocephalic Respiratory - Lungs CTA bilateral Cardiac - RRR S1 and S2 without murmur GI - Abdomen soft Nontender and bowel sounds active x 4 Extremities - No edema. Neuro - Grossly intact.  Results for orders placed in visit on 05/21/14  POCT UA - MICROSCOPIC ONLY      Result Value Ref Range   WBC, Ur, HPF, POC 5-8     RBC, urine, microscopic 1-3     Bacteria, U Microscopic few     Mucus, UA negative     Epithelial cells, urine per micros few     Crystals, Ur, HPF, POC negative     Casts, Ur, LPF, POC negative     Yeast, UA negative    POCT URINALYSIS DIPSTICK      Result Value Ref Range   Color, UA gold     Clarity, UA clear     Glucose, UA negative     Bilirubin, UA negative     Ketones, UA negative     Spec Grav, UA 1.010     Blood, UA negative     pH, UA 5.0     Protein, UA negative     Urobilinogen, UA negative     Nitrite, UA negative     Leukocytes, UA Trace        Assessment & Plan:  Urinary frequency - Plan: POCT UA - Microscopic Only, POCT urinalysis dipstick, Urine culture, ciprofloxacin (CIPRO) 500 MG tablet  Urinary tract infection without hematuria, site unspecified - Plan: Urine culture, ciprofloxacin (CIPRO) 500 MG tablet  Push po fluids, rest, tylenol and motrin otc prn as directed for fever, arthralgias, and myalgias.  Follow up prn if sx's continue or persist.  Lysbeth Penner FNP

## 2014-05-23 LAB — URINE CULTURE

## 2014-11-03 ENCOUNTER — Other Ambulatory Visit: Payer: Self-pay | Admitting: Nurse Practitioner

## 2014-11-03 ENCOUNTER — Telehealth: Payer: Self-pay | Admitting: Nurse Practitioner

## 2014-11-03 MED ORDER — NITROFURANTOIN MONOHYD MACRO 100 MG PO CAPS: 100.0000 mg | ORAL_CAPSULE | Freq: Two times a day (BID) | ORAL | Status: DC

## 2014-11-03 NOTE — Telephone Encounter (Signed)
Urinary tract infection with urgency and frequency- patient aware that macrobid rx sent to pharmacy

## 2014-12-19 ENCOUNTER — Other Ambulatory Visit: Payer: Self-pay | Admitting: Nurse Practitioner

## 2014-12-28 ENCOUNTER — Other Ambulatory Visit: Payer: Self-pay | Admitting: Nurse Practitioner

## 2014-12-28 NOTE — Telephone Encounter (Signed)
Have to be seen to dx flu

## 2014-12-31 NOTE — Telephone Encounter (Signed)
Stp advised we would need to see her and evaluate prior to calling in any medication. Pt states she has been feeling better.

## 2015-03-06 ENCOUNTER — Other Ambulatory Visit: Payer: Self-pay | Admitting: Nurse Practitioner

## 2015-03-07 MED ORDER — LISINOPRIL 20 MG PO TABS: 20.0000 mg | ORAL_TABLET | Freq: Every day | ORAL | Status: DC

## 2015-03-07 NOTE — Telephone Encounter (Signed)
Lisinopril refilled-Patient NTBS for follow up and lab work

## 2015-03-07 NOTE — Telephone Encounter (Signed)
Please call to be seen . It is time for labwork. One refill given on blood pressure medication.

## 2015-03-12 ENCOUNTER — Telehealth: Payer: Self-pay | Admitting: Nurse Practitioner

## 2015-03-12 ENCOUNTER — Encounter: Payer: Self-pay | Admitting: Nurse Practitioner

## 2015-03-12 ENCOUNTER — Ambulatory Visit (INDEPENDENT_AMBULATORY_CARE_PROVIDER_SITE_OTHER): Payer: BLUE CROSS/BLUE SHIELD | Admitting: Nurse Practitioner

## 2015-03-12 VITALS — BP 129/76 | HR 65 | Temp 97.7°F | Ht 64.0 in | Wt 180.0 lb

## 2015-03-12 DIAGNOSIS — N3000 Acute cystitis without hematuria: Secondary | ICD-10-CM

## 2015-03-12 DIAGNOSIS — M545 Low back pain, unspecified: Secondary | ICD-10-CM

## 2015-03-12 LAB — POCT URINALYSIS DIPSTICK
Bilirubin, UA: NEGATIVE
GLUCOSE UA: NEGATIVE
Ketones, UA: NEGATIVE
NITRITE UA: NEGATIVE
Protein, UA: NEGATIVE
Spec Grav, UA: 1.01
Urobilinogen, UA: NEGATIVE
pH, UA: 7.5

## 2015-03-12 LAB — POCT UA - MICROSCOPIC ONLY
Bacteria, U Microscopic: NEGATIVE
CASTS, UR, LPF, POC: NEGATIVE
CRYSTALS, UR, HPF, POC: NEGATIVE
Mucus, UA: NEGATIVE
Yeast, UA: NEGATIVE

## 2015-03-12 MED ORDER — SULFAMETHOXAZOLE-TRIMETHOPRIM 800-160 MG PO TABS: 1.0000 | ORAL_TABLET | Freq: Two times a day (BID) | ORAL | Status: DC

## 2015-03-12 NOTE — Progress Notes (Signed)
   Subjective:    Patient ID: Krista Gonzales, female    DOB: 1950/11/26, 64 y.o.   MRN: [id number]  HPI Patient in c/o feeling like she has to pee all the time- In the mornings when she goes to the bathroom her pee is so strong that the smells will "run you out of bathroom". Has been going on for months.    Review of Systems  Constitutional: Negative for fever and chills.  HENT: Negative.   Respiratory: Negative.   Cardiovascular: Negative.   Genitourinary: Positive for urgency and frequency.  Neurological: Negative.   Psychiatric/Behavioral: Negative.   All other systems reviewed and are negative.      Objective:   Physical Exam  Constitutional: She is oriented to person, place, and time. She appears well-developed and well-nourished.  Cardiovascular: Normal rate, regular rhythm and normal heart sounds.   Pulmonary/Chest: Effort normal and breath sounds normal.  Neurological: She is alert and oriented to person, place, and time.  Skin: Skin is warm and dry.  Psychiatric: She has a normal mood and affect. Her behavior is normal. Judgment and thought content normal.   BP 129/76 mmHg  Pulse 65  Temp(Src) 97.7 F (36.5 C) (Oral)  Ht 5\' 4"  (1.626 m)  Wt 180 lb (81.647 kg)  BMI 30.88 kg/m2       Assessment & Plan:   1. Bilateral low back pain without sciatica   2. Acute cystitis without hematuria    Meds ordered this encounter  Medications  . sulfamethoxazole-trimethoprim (BACTRIM DS) 800-160 MG per tablet    Sig: Take 1 tablet by mouth 2 (two) times daily.    Dispense:  14 tablet    Refill:  0    Order Specific Question:  Supervising Provider    Answer:  Chipper Herb [[id number]]   Take medication as prescribe Cotton underwear Take shower not bath Cranberry juice, yogurt Force fluids AZO over the counter X2 days Culture pending RTO prn  Mary-Margaret Hassell Done, FNP

## 2015-03-12 NOTE — Addendum Note (Signed)
Addended by: Claretta Fraise on: 03/12/2015 05:55 PM   Modules accepted: Miquel Dunn

## 2015-03-12 NOTE — Patient Instructions (Signed)
Take medication as prescribe Cotton underwear Take shower not bath Cranberry juice, yogurt Force fluids AZO over the counter X2 days Culture pending RTO prn  

## 2015-03-14 LAB — URINE CULTURE: Organism ID, Bacteria: NO GROWTH

## 2015-03-15 ENCOUNTER — Telehealth: Payer: Self-pay | Admitting: Nurse Practitioner

## 2015-03-15 NOTE — Telephone Encounter (Signed)
Discussed with patient.  Patient states that this has been recurrent since last year. Would you suggest a urology referral at this point? She did mention that he had her bladder tacked about 20 years ago and she feels that it has prolapsed again.

## 2015-03-15 NOTE — Telephone Encounter (Signed)
That happens sometimes- is antibiotic not helping?

## 2015-04-01 ENCOUNTER — Telehealth: Payer: Self-pay

## 2015-04-01 DIAGNOSIS — M545 Low back pain, unspecified: Secondary | ICD-10-CM

## 2015-04-01 NOTE — Telephone Encounter (Signed)
Referral made 

## 2015-04-01 NOTE — Telephone Encounter (Signed)
Patient said she has been waiting a month for back referral   ( Nothing in workqueue)

## 2015-04-02 NOTE — Telephone Encounter (Signed)
debbi referral is in now

## 2015-04-09 ENCOUNTER — Other Ambulatory Visit: Payer: Self-pay | Admitting: Nurse Practitioner

## 2015-04-09 DIAGNOSIS — R8290 Unspecified abnormal findings in urine: Secondary | ICD-10-CM

## 2015-06-06 ENCOUNTER — Other Ambulatory Visit: Payer: Self-pay | Admitting: Nurse Practitioner

## 2015-06-07 NOTE — Telephone Encounter (Signed)
Patient NTBS for follow up and lab work  

## 2015-06-07 NOTE — Telephone Encounter (Signed)
Pt aware NTBS & labs before next refill. Labs last done yr ago

## 2015-06-07 NOTE — Telephone Encounter (Signed)
No routine lab work since 04/2014

## 2015-07-08 ENCOUNTER — Ambulatory Visit (INDEPENDENT_AMBULATORY_CARE_PROVIDER_SITE_OTHER): Payer: BLUE CROSS/BLUE SHIELD | Admitting: Pediatrics

## 2015-07-08 ENCOUNTER — Encounter: Payer: Self-pay | Admitting: Pediatrics

## 2015-07-08 VITALS — BP 130/80 | HR 63 | Temp 97.2°F | Ht 64.0 in | Wt 186.6 lb

## 2015-07-08 DIAGNOSIS — Z5181 Encounter for therapeutic drug level monitoring: Secondary | ICD-10-CM | POA: Diagnosis not present

## 2015-07-08 DIAGNOSIS — E559 Vitamin D deficiency, unspecified: Secondary | ICD-10-CM

## 2015-07-08 DIAGNOSIS — I10 Essential (primary) hypertension: Secondary | ICD-10-CM

## 2015-07-08 DIAGNOSIS — R5383 Other fatigue: Secondary | ICD-10-CM | POA: Diagnosis not present

## 2015-07-08 MED ORDER — LISINOPRIL 20 MG PO TABS: 20.0000 mg | ORAL_TABLET | Freq: Every day | ORAL | Status: DC

## 2015-07-08 MED ORDER — FUROSEMIDE 40 MG PO TABS: 40.0000 mg | ORAL_TABLET | Freq: Every day | ORAL | Status: DC

## 2015-07-08 MED ORDER — AMLODIPINE BESYLATE 5 MG PO TABS: 5.0000 mg | ORAL_TABLET | Freq: Every day | ORAL | Status: DC

## 2015-07-08 NOTE — Progress Notes (Addendum)
Subjective:    Patient ID: Krista Gonzales, female    DOB: 09/27/1951, 64 y.o.   MRN: [id number]  HPI: Krista Gonzales is a 64 y.o. female presenting on 07/08/2015 for Bloated and Fatigue  Feels tired all the time, bloated. She gets bloated worse after eating or drinking. Not present in the morning after waking, then starts after first meal, stays throughout day. Feels like she can't wear tight clothing because it compresses her middle too much and is uncomfortable. Does not feel like she has fluid on her belly. No nausea, no vomiting. Doesn't matter what she eats, bloating happens with all food. Going on for several months, has tried beano for gas, did not make a difference. Feels "like her insides are falling in". Had a negative colonoscopy 9 years ago per pt.  Mood has been fine, lives with husband, he is in fair health has DM2, HLD. Granddaughters live nearby, 22, 24yo in nursing school, she stays busy. Sleep has been ok.  Works at Clinical biochemist at [employer], on feet much of day, tiring.  Multiple surgeries:  Gall bladder removed around 2005.  Partial hysterectomy for prolapse. Bladder tack, rectum repair.  Umbilical hernia repaired several years ago, she feels like symptoms are back but she can always reduce it, no pain. Tumor in breast removed (not cancer).    Relevant past medical, surgical, family and social history reviewed and updated as indicated. Interim medical history since our last visit reviewed. Allergies and medications reviewed and updated.  Review of Systems  Constitutional: Positive for malaise/fatigue. Negative for fever and weight loss.  HENT: Negative for congestion.   Eyes: Negative.   Respiratory: Negative for cough.   Cardiovascular: Negative for chest pain, palpitations and leg swelling.  Gastrointestinal: Negative for nausea, vomiting, diarrhea, constipation and blood in stool.  Genitourinary: Negative.   Musculoskeletal: Negative for myalgias  and falls.  Skin: Negative for rash.  Neurological: Negative for focal weakness, weakness and headaches.  Psychiatric/Behavioral: Negative for depression. The patient is not nervous/anxious and does not have insomnia.       Current Outpatient Prescriptions  Medication Sig Dispense Refill  . amLODipine (NORVASC) 5 MG tablet Take 1 tablet (5 mg total) by mouth daily. 90 tablet 1  . furosemide (LASIX) 40 MG tablet Take 1 tablet (40 mg total) by mouth daily. 90 tablet 0  . lisinopril (PRINIVIL,ZESTRIL) 20 MG tablet Take 1 tablet (20 mg total) by mouth daily. 90 tablet 1  . Multiple Vitamins-Minerals (MULTIVITAMIN WITH MINERALS) tablet Take 1 tablet by mouth daily.     No current facility-administered medications for this visit.       Objective:    BP 130/80 mmHg  Pulse 63  Temp(Src) 97.2 F (36.2 C) (Oral)  Ht 5\' 4"  (1.626 m)  Wt 186 lb 9.6 oz (84.641 kg)  BMI 32.01 kg/m2  Wt Readings from Last 3 Encounters:  07/08/15 186 lb 9.6 oz (84.641 kg)  03/12/15 180 lb (81.647 kg)  05/21/14 188 lb 9.6 oz (85.548 kg)    Gen: NAD, alert, cooperative with exam, NCAT EYES: EOMI, no scleral injection or icterus ENT:  TMs pearly gray b/l, OP without erythema LYMPH: no cervical LAD, no thyroid nodules felt CV: NRRR, normal S1/S2, no murmur, DP pulses 2+ b/l Resp: CTABL, no wheezes, normal WOB Abd: +BS, soft, mildly tender with deep palpation throughout. No guarding or organomegaly Ext: No edema, warm Neuro: Alert and oriented, strength equal b/l UE and LE, coordination  grossly normal MSK: normal muscle bulk SKIN: pale     Assessment & Plan:    Krista Gonzales was seen today for bloated and fatigue, also medication refill.   Diagnoses and all orders for this visit:  Essential hypertension well controlled today. Continue current medications, refills sent in. -     CMP14+EGFR -     amLODipine (NORVASC) 5 MG tablet; Take 1 tablet (5 mg total) by mouth daily. -     furosemide (LASIX) 40 MG  tablet; Take 1 tablet (40 mg total) by mouth daily. -     lisinopril (PRINIVIL,ZESTRIL) 20 MG tablet; Take 1 tablet (20 mg total) by mouth daily.   Fatigue and bloating: ongoing for past several months with no improvement with beano. No net weight changes during this time. Decreased appetite and abdominal discomfort/fullness. No mood changes. Broad Ddx, will start with labs as below. If normal consider transvaginal u/s to evaluate for ovarian pathology, treatment of small bowel bacterial overgrowth. -     CBC -     TSH -     CMP -     Vit D  25 hydroxy (rtn osteoporosis monitoring)  Follow up plan: Return in about 4 weeks (around 08/05/2015).  Assunta Found, MD [facility] 07/08/2015, 5:14 PM

## 2015-07-09 LAB — CBC
HEMATOCRIT: 38.6 % (ref 34.0–46.6)
HEMOGLOBIN: 12.9 g/dL (ref 11.1–15.9)
MCH: 29.1 pg (ref 26.6–33.0)
MCHC: 33.4 g/dL (ref 31.5–35.7)
MCV: 87 fL (ref 79–97)
Platelets: 266 10*3/uL (ref 150–379)
RBC: 4.43 x10E6/uL (ref 3.77–5.28)
RDW: 14.1 % (ref 12.3–15.4)
WBC: 7.2 10*3/uL (ref 3.4–10.8)

## 2015-07-09 LAB — CMP14+EGFR
A/G RATIO: 2 (ref 1.1–2.5)
ALBUMIN: 4.3 g/dL (ref 3.6–4.8)
ALK PHOS: 76 IU/L (ref 39–117)
ALT: 12 IU/L (ref 0–32)
AST: 18 IU/L (ref 0–40)
BILIRUBIN TOTAL: 0.3 mg/dL (ref 0.0–1.2)
BUN / CREAT RATIO: 22 (ref 11–26)
BUN: 14 mg/dL (ref 8–27)
CHLORIDE: 104 mmol/L (ref 97–108)
CO2: 22 mmol/L (ref 18–29)
Calcium: 8.9 mg/dL (ref 8.7–10.3)
Creatinine, Ser: 0.65 mg/dL (ref 0.57–1.00)
GFR calc Af Amer: 111 mL/min/{1.73_m2} (ref >59)
GFR calc non Af Amer: 96 mL/min/{1.73_m2} (ref >59)
GLOBULIN, TOTAL: 2.1 g/dL (ref 1.5–4.5)
Glucose: 82 mg/dL (ref 65–99)
POTASSIUM: 3.9 mmol/L (ref 3.5–5.2)
SODIUM: 145 mmol/L — AB (ref 134–144)
Total Protein: 6.4 g/dL (ref 6.0–8.5)

## 2015-07-09 LAB — VITAMIN D 25 HYDROXY (VIT D DEFICIENCY, FRACTURES): VIT D 25 HYDROXY: 17.5 ng/mL — AB (ref 30.0–100.0)

## 2015-07-09 LAB — TSH: TSH: 2.04 u[IU]/mL (ref 0.450–4.500)

## 2015-07-10 MED ORDER — CHOLECALCIFEROL 1.25 MG (50000 UT) PO TABS: 50000.0000 [IU] | ORAL_TABLET | ORAL | Status: DC

## 2015-07-10 NOTE — Addendum Note (Signed)
Addended by: Eustaquio Maize on: 07/10/2015 01:41 PM   Modules accepted: Orders

## 2015-10-22 ENCOUNTER — Other Ambulatory Visit: Payer: Self-pay | Admitting: Nurse Practitioner

## 2015-10-22 DIAGNOSIS — I10 Essential (primary) hypertension: Secondary | ICD-10-CM

## 2015-10-22 MED ORDER — LISINOPRIL 20 MG PO TABS: 20.0000 mg | ORAL_TABLET | Freq: Every day | ORAL | Status: DC

## 2015-10-22 MED ORDER — FUROSEMIDE 40 MG PO TABS: 40.0000 mg | ORAL_TABLET | Freq: Every day | ORAL | Status: DC

## 2015-11-19 ENCOUNTER — Ambulatory Visit (INDEPENDENT_AMBULATORY_CARE_PROVIDER_SITE_OTHER): Payer: BLUE CROSS/BLUE SHIELD | Admitting: Nurse Practitioner

## 2015-11-19 ENCOUNTER — Encounter: Payer: Self-pay | Admitting: Nurse Practitioner

## 2015-11-19 VITALS — BP 164/86 | HR 55 | Temp 97.1°F | Ht 64.0 in | Wt 183.0 lb

## 2015-11-19 DIAGNOSIS — R609 Edema, unspecified: Secondary | ICD-10-CM | POA: Diagnosis not present

## 2015-11-19 DIAGNOSIS — Z6831 Body mass index (BMI) 31.0-31.9, adult: Secondary | ICD-10-CM | POA: Diagnosis not present

## 2015-11-19 DIAGNOSIS — E669 Obesity, unspecified: Secondary | ICD-10-CM | POA: Insufficient documentation

## 2015-11-19 DIAGNOSIS — Z23 Encounter for immunization: Secondary | ICD-10-CM | POA: Diagnosis not present

## 2015-11-19 DIAGNOSIS — Z1212 Encounter for screening for malignant neoplasm of rectum: Secondary | ICD-10-CM | POA: Diagnosis not present

## 2015-11-19 DIAGNOSIS — Z1159 Encounter for screening for other viral diseases: Secondary | ICD-10-CM

## 2015-11-19 DIAGNOSIS — I10 Essential (primary) hypertension: Secondary | ICD-10-CM

## 2015-11-19 MED ORDER — AMLODIPINE BESYLATE 5 MG PO TABS: 5.0000 mg | ORAL_TABLET | Freq: Every day | ORAL | Status: DC

## 2015-11-19 MED ORDER — FUROSEMIDE 40 MG PO TABS: 40.0000 mg | ORAL_TABLET | Freq: Every day | ORAL | Status: DC

## 2015-11-19 MED ORDER — LISINOPRIL 40 MG PO TABS: 40.0000 mg | ORAL_TABLET | Freq: Every day | ORAL | Status: DC

## 2015-11-19 NOTE — Progress Notes (Addendum)
   Subjective:    Patient ID: Krista Gonzales, female    DOB: 1951/01/10, 65 y.o.   MRN: [id number]  Patient here today for follow up of chronic medical problems.   Hypertension This is a chronic problem. The current episode started more than 1 year ago. The problem has been resolved since onset. The problem is controlled. Pertinent negatives include no blurred vision, headaches, neck pain, palpitations, PND or sweats. There are no associated agents to hypertension. Risk factors for coronary artery disease include post-menopausal state and sedentary lifestyle. Past treatments include calcium channel blockers, ACE inhibitors and diuretics. The current treatment provides significant improvement. Compliance problems include diet and exercise.       Review of Systems  Eyes: Negative for blurred vision.  Cardiovascular: Negative for palpitations and PND.  Musculoskeletal: Negative for neck pain.  Neurological: Negative for headaches.       Objective:   Physical Exam  Constitutional: She is oriented to person, place, and time. She appears well-developed and well-nourished.  HENT:  Nose: Nose normal.  Mouth/Throat: Oropharynx is clear and moist.  Eyes: EOM are normal.  Neck: Trachea normal, normal range of motion and full passive range of motion without pain. Neck supple. No JVD present. Carotid bruit is not present. No thyromegaly present.  Cardiovascular: Normal rate, regular rhythm, normal heart sounds and intact distal pulses.  Exam reveals no gallop and no friction rub.   No murmur heard. Pulmonary/Chest: Effort normal and breath sounds normal.  Abdominal: Soft. Bowel sounds are normal. She exhibits no distension and no mass. There is no tenderness.  Musculoskeletal: Normal range of motion.  Lymphadenopathy:    She has no cervical adenopathy.  Neurological: She is alert and oriented to person, place, and time. She has normal reflexes.  Skin: Skin is warm and dry.  Psychiatric: She  has a normal mood and affect. Her behavior is normal. Judgment and thought content normal.    BP 164/86 mmHg  Pulse 55  Temp(Src) 97.1 F (36.2 C) (Oral)  Ht '5\' 4"'$  (1.626 m)  Wt 183 lb (83.008 kg)  BMI 31.40 kg/m2        Assessment & Plan:   1. Essential hypertension Do not add salt to diet Increased lisinopril to '40mg'$  daily from '20mg'$  daily - CMP14+EGFR - Lipid panel - amLODipine (NORVASC) 5 MG tablet; Take 1 tablet (5 mg total) by mouth daily.  Dispense: 90 tablet; Refill: 1 - lisinopril (PRINIVIL,ZESTRIL) 40 MG tablet; Take 1 tablet (40 mg total) by mouth daily.  Dispense: 90 tablet; Refill: 1  2. Peripheral edema Elevate legs when sitting - furosemide (LASIX) 40 MG tablet; Take 1 tablet (40 mg total) by mouth daily.  Dispense: 90 tablet; Refill: 1  3. BMI 31.0-31.9,adult Discussed diet and exercise for person with BMI >25 Will recheck weight in 3-6 months   4. Screening for malignant neoplasm of the rectum - Fecal occult blood, imunochemical; Future  5. Need for hepatitis C screening test - Hepatitis C antibody    Labs pending Health maintenance reviewed Diet and exercise encouraged Continue all meds Follow up  In 3 months   [place], FNP

## 2015-11-19 NOTE — Patient Instructions (Signed)
Health Maintenance, Female Adopting a healthy lifestyle and getting preventive care can go a long way to promote health and wellness. Talk with your health care provider about what schedule of regular examinations is right for you. This is a good chance for you to check in with your provider about disease prevention and staying healthy. In between checkups, there are plenty of things you can do on your own. Experts have done a lot of research about which lifestyle changes and preventive measures are most likely to keep you healthy. Ask your health care provider for more information. WEIGHT AND DIET  Eat a healthy diet  Be sure to include plenty of vegetables, fruits, low-fat dairy products, and lean protein.  Do not eat a lot of foods high in solid fats, added sugars, or salt.  Get regular exercise. This is one of the most important things you can do for your health.  Most adults should exercise for at least 150 minutes each week. The exercise should increase your heart rate and make you sweat (moderate-intensity exercise).  Most adults should also do strengthening exercises at least twice a week. This is in addition to the moderate-intensity exercise.  Maintain a healthy weight  Body mass index (BMI) is a measurement that can be used to identify possible weight problems. It estimates body fat based on height and weight. Your health care provider can help determine your BMI and help you achieve or maintain a healthy weight.  For females 20 years of age and older:   A BMI below 18.5 is considered underweight.  A BMI of 18.5 to 24.9 is normal.  A BMI of 25 to 29.9 is considered overweight.  A BMI of 30 and above is considered obese.  Watch levels of cholesterol and blood lipids  You should start having your blood tested for lipids and cholesterol at 63 years of age, then have this test every 5 years.  You may need to have your cholesterol levels checked more often if:  Your lipid  or cholesterol levels are high.  You are older than 63 years of age.  You are at high risk for heart disease.  CANCER SCREENING   Lung Cancer  Lung cancer screening is recommended for adults 55-80 years old who are at high risk for lung cancer because of a history of smoking.  A yearly low-dose CT scan of the lungs is recommended for people who:  Currently smoke.  Have quit within the past 15 years.  Have at least a 30-pack-year history of smoking. A pack year is smoking an average of one pack of cigarettes a day for 1 year.  Yearly screening should continue until it has been 15 years since you quit.  Yearly screening should stop if you develop a health problem that would prevent you from having lung cancer treatment.  Breast Cancer  Practice breast self-awareness. This means understanding how your breasts normally appear and feel.  It also means doing regular breast self-exams. Let your health care provider know about any changes, no matter how small.  If you are in your 20s or 30s, you should have a clinical breast exam (CBE) by a health care provider every 1-3 years as part of a regular health exam.  If you are 40 or older, have a CBE every year. Also consider having a breast X-ray (mammogram) every year.  If you have a family history of breast cancer, talk to your health care provider about genetic screening.  If you   are at high risk for breast cancer, talk to your health care provider about having an MRI and a mammogram every year.  Breast cancer gene (BRCA) assessment is recommended for women who have family members with BRCA-related cancers. BRCA-related cancers include:  Breast.  Ovarian.  Tubal.  Peritoneal cancers.  Results of the assessment will determine the need for genetic counseling and BRCA1 and BRCA2 testing. Cervical Cancer Your health care provider may recommend that you be screened regularly for cancer of the pelvic organs (ovaries, uterus, and  vagina). This screening involves a pelvic examination, including checking for microscopic changes to the surface of your cervix (Pap test). You may be encouraged to have this screening done every 3 years, beginning at age 21.  For women ages 30-65, health care providers may recommend pelvic exams and Pap testing every 3 years, or they may recommend the Pap and pelvic exam, combined with testing for human papilloma virus (HPV), every 5 years. Some types of HPV increase your risk of cervical cancer. Testing for HPV may also be done on women of any age with unclear Pap test results.  Other health care providers may not recommend any screening for nonpregnant women who are considered low risk for pelvic cancer and who do not have symptoms. Ask your health care provider if a screening pelvic exam is right for you.  If you have had past treatment for cervical cancer or a condition that could lead to cancer, you need Pap tests and screening for cancer for at least 20 years after your treatment. If Pap tests have been discontinued, your risk factors (such as having a new sexual partner) need to be reassessed to determine if screening should resume. Some women have medical problems that increase the chance of getting cervical cancer. In these cases, your health care provider may recommend more frequent screening and Pap tests. Colorectal Cancer  This type of cancer can be detected and often prevented.  Routine colorectal cancer screening usually begins at 63 years of age and continues through 63 years of age.  Your health care provider may recommend screening at an earlier age if you have risk factors for colon cancer.  Your health care provider may also recommend using home test kits to check for hidden blood in the stool.  A small camera at the end of a tube can be used to examine your colon directly (sigmoidoscopy or colonoscopy). This is done to check for the earliest forms of colorectal  cancer.  Routine screening usually begins at age 50.  Direct examination of the colon should be repeated every 5-10 years through 63 years of age. However, you may need to be screened more often if early forms of precancerous polyps or small growths are found. Skin Cancer  Check your skin from head to toe regularly.  Tell your health care provider about any new moles or changes in moles, especially if there is a change in a mole's shape or color.  Also tell your health care provider if you have a mole that is larger than the size of a pencil eraser.  Always use sunscreen. Apply sunscreen liberally and repeatedly throughout the day.  Protect yourself by wearing long sleeves, pants, a wide-brimmed hat, and sunglasses whenever you are outside. HEART DISEASE, DIABETES, AND HIGH BLOOD PRESSURE   High blood pressure causes heart disease and increases the risk of stroke. High blood pressure is more likely to develop in:  People who have blood pressure in the high end   of the normal range (130-139/85-89 mm Hg).  People who are overweight or obese.  People who are African American.  If you are 38-23 years of age, have your blood pressure checked every 3-5 years. If you are 61 years of age or older, have your blood pressure checked every year. You should have your blood pressure measured twice--once when you are at a hospital or clinic, and once when you are not at a hospital or clinic. Record the average of the two measurements. To check your blood pressure when you are not at a hospital or clinic, you can use:  An automated blood pressure machine at a pharmacy.  A home blood pressure monitor.  If you are between 45 years and 39 years old, ask your health care provider if you should take aspirin to prevent strokes.  Have regular diabetes screenings. This involves taking a blood sample to check your fasting blood sugar level.  If you are at a normal weight and have a low risk for diabetes,  have this test once every three years after 63 years of age.  If you are overweight and have a high risk for diabetes, consider being tested at a younger age or more often. PREVENTING INFECTION  Hepatitis B  If you have a higher risk for hepatitis B, you should be screened for this virus. You are considered at high risk for hepatitis B if:  You were born in a country where hepatitis B is common. Ask your health care provider which countries are considered high risk.  Your parents were born in a high-risk country, and you have not been immunized against hepatitis B (hepatitis B vaccine).  You have HIV or AIDS.  You use needles to inject street drugs.  You live with someone who has hepatitis B.  You have had sex with someone who has hepatitis B.  You get hemodialysis treatment.  You take certain medicines for conditions, including cancer, organ transplantation, and autoimmune conditions. Hepatitis C  Blood testing is recommended for:  Everyone born from [birth year] through 1963.  Anyone with known risk factors for hepatitis C. Sexually transmitted infections (STIs)  You should be screened for sexually transmitted infections (STIs) including gonorrhea and chlamydia if:  You are sexually active and are younger than 63 years of age.  You are older than 63 years of age and your health care provider tells you that you are at risk for this type of infection.  Your sexual activity has changed since you were last screened and you are at an increased risk for chlamydia or gonorrhea. Ask your health care provider if you are at risk.  If you do not have HIV, but are at risk, it may be recommended that you take a prescription medicine daily to prevent HIV infection. This is called pre-exposure prophylaxis (PrEP). You are considered at risk if:  You are sexually active and do not regularly use condoms or know the HIV status of your partner(s).  You take drugs by injection.  You are sexually  active with a partner who has HIV. Talk with your health care provider about whether you are at high risk of being infected with HIV. If you choose to begin PrEP, you should first be tested for HIV. You should then be tested every 3 months for as long as you are taking PrEP.  PREGNANCY   If you are premenopausal and you may become pregnant, ask your health care provider about preconception counseling.  If you may  become pregnant, take 400 to 800 micrograms (mcg) of folic acid every day.  If you want to prevent pregnancy, talk to your health care provider about birth control (contraception). OSTEOPOROSIS AND MENOPAUSE   Osteoporosis is a disease in which the bones lose minerals and strength with aging. This can result in serious bone fractures. Your risk for osteoporosis can be identified using a bone density scan.  If you are 61 years of age or older, or if you are at risk for osteoporosis and fractures, ask your health care provider if you should be screened.  Ask your health care provider whether you should take a calcium or vitamin D supplement to lower your risk for osteoporosis.  Menopause may have certain physical symptoms and risks.  Hormone replacement therapy may reduce some of these symptoms and risks. Talk to your health care provider about whether hormone replacement therapy is right for you.  HOME CARE INSTRUCTIONS   Schedule regular health, dental, and eye exams.  Stay current with your immunizations.   Do not use any tobacco products including cigarettes, chewing tobacco, or electronic cigarettes.  If you are pregnant, do not drink alcohol.  If you are breastfeeding, limit how much and how often you drink alcohol.  Limit alcohol intake to no more than 1 drink per day for nonpregnant women. One drink equals 12 ounces of beer, 5 ounces of wine, or 1 ounces of hard liquor.  Do not use street drugs.  Do not share needles.  Ask your health care provider for help if  you need support or information about quitting drugs.  Tell your health care provider if you often feel depressed.  Tell your health care provider if you have ever been abused or do not feel safe at home.   This information is not intended to replace advice given to you by your health care provider. Make sure you discuss any questions you have with your health care provider.   Document Released: 05/11/2011 Document Revised: 11/16/2014 Document Reviewed: 09/27/2013 Elsevier Interactive Patient Education [employer].

## 2015-11-20 LAB — CMP14+EGFR
ALBUMIN: 4.1 g/dL (ref 3.6–4.8)
ALK PHOS: 72 IU/L (ref 39–117)
ALT: 13 IU/L (ref 0–32)
AST: 15 IU/L (ref 0–40)
Albumin/Globulin Ratio: 1.8 (ref 1.1–2.5)
BUN / CREAT RATIO: 23 (ref 11–26)
BUN: 15 mg/dL (ref 8–27)
Bilirubin Total: 0.2 mg/dL (ref 0.0–1.2)
CO2: 25 mmol/L (ref 18–29)
CREATININE: 0.64 mg/dL (ref 0.57–1.00)
Calcium: 9.3 mg/dL (ref 8.7–10.3)
Chloride: 108 mmol/L — ABNORMAL HIGH (ref 96–106)
GFR calc non Af Amer: 96 mL/min/{1.73_m2} (ref >59)
GFR, EST AFRICAN AMERICAN: 111 mL/min/{1.73_m2} (ref >59)
GLOBULIN, TOTAL: 2.3 g/dL (ref 1.5–4.5)
GLUCOSE: 91 mg/dL (ref 65–99)
Potassium: 3.9 mmol/L (ref 3.5–5.2)
SODIUM: 147 mmol/L — AB (ref 134–144)
TOTAL PROTEIN: 6.4 g/dL (ref 6.0–8.5)

## 2015-11-20 LAB — LIPID PANEL
CHOL/HDL RATIO: 3.1 ratio (ref 0.0–4.4)
CHOLESTEROL TOTAL: 166 mg/dL (ref 100–199)
HDL: 54 mg/dL (ref >39)
LDL CALC: 92 mg/dL (ref 0–99)
Triglycerides: 101 mg/dL (ref 0–149)
VLDL CHOLESTEROL CAL: 20 mg/dL (ref 5–40)

## 2015-11-20 LAB — HEPATITIS C ANTIBODY

## 2015-11-25 ENCOUNTER — Other Ambulatory Visit: Payer: BLUE CROSS/BLUE SHIELD

## 2015-11-25 DIAGNOSIS — Z1212 Encounter for screening for malignant neoplasm of rectum: Secondary | ICD-10-CM

## 2015-11-27 LAB — FECAL OCCULT BLOOD, IMMUNOCHEMICAL: Fecal Occult Bld: NEGATIVE

## 2015-12-23 ENCOUNTER — Telehealth: Payer: Self-pay | Admitting: Nurse Practitioner

## 2016-02-03 ENCOUNTER — Telehealth: Payer: Self-pay | Admitting: Nurse Practitioner

## 2016-02-03 ENCOUNTER — Encounter: Payer: Self-pay | Admitting: Family Medicine

## 2016-02-03 ENCOUNTER — Ambulatory Visit (INDEPENDENT_AMBULATORY_CARE_PROVIDER_SITE_OTHER): Payer: BLUE CROSS/BLUE SHIELD | Admitting: Family Medicine

## 2016-02-03 VITALS — BP 127/80 | HR 84 | Temp 97.1°F | Ht 64.0 in | Wt 182.8 lb

## 2016-02-03 DIAGNOSIS — H538 Other visual disturbances: Secondary | ICD-10-CM | POA: Diagnosis not present

## 2016-02-03 NOTE — Telephone Encounter (Signed)
Patient advised that we are out of appointments and she will need to be seen. Patient states that her BP is elevated, she is dizzy and has a bad headache.  Patient advised that if her symptoms get worse then she should go to the ER.  I offered patient an appointment for in the am and she will call us back to schedule if symptoms do not improve.

## 2016-02-03 NOTE — Progress Notes (Signed)
Subjective:  Patient ID: Krista Gonzales, female    DOB: 1951/04/15  Age: 65 y.o. MRN: [id number]  CC: vision changes   HPI [facility] presents for vision  Changes of sudden onset this AM. She has distant vision OS, near, OD. This AM when she tried to put in her contact lens she couldn't see clearly OS. Tried a second contact, then switched to glasses, still had severe blurring of OS. OD remains nml and reads with it - has bifocal lens inglasses as well. Pt. Has near daily HA. She states it is bilateral heavy 3-4/10 each A.M. Not unusual today. Denies and focal numbness or weakness. No facial weakness. On arrival hear she can count fingers OS but < 20/400 OS   History Krista Gonzales has a past medical history of Hypertension.   She has past surgical history that includes Gallbladder surgery and Tumor removal.   Her family history is not on file.She reports that she has never smoked. She has never used smokeless tobacco. She reports that she does not drink alcohol or use illicit drugs.    ROS Review of Systems  Constitutional: Negative for fever, activity change and appetite change.  HENT: Negative for congestion, rhinorrhea and sore throat.   Eyes: Positive for visual disturbance. Negative for photophobia, pain and discharge.  Respiratory: Negative for cough and shortness of breath.   Cardiovascular: Negative for chest pain and palpitations.  Gastrointestinal: Negative for nausea, abdominal pain and diarrhea.  Genitourinary: Negative for dysuria.  Musculoskeletal: Negative for myalgias and arthralgias.    Objective:  BP 127/80 mmHg  Pulse 84  Temp(Src) 97.1 F (36.2 C) (Oral)  Ht 5\' 4"  (1.626 m)  Wt 182 lb 12.8 oz (82.918 kg)  BMI 31.36 kg/m2  SpO2 98%  BP Readings from Last 3 Encounters:  02/03/16 127/80  11/19/15 164/86  07/08/15 130/80    Wt Readings from Last 3 Encounters:  02/03/16 182 lb 12.8 oz (82.918 kg)  11/19/15 183 lb (83.008 kg)  07/08/15 186 lb 9.6 oz  (84.641 kg)     Physical Exam  Constitutional: She is oriented to person, place, and time. She appears well-developed and well-nourished. No distress.  HENT:  Head: Normocephalic and atraumatic.  Eyes: Conjunctivae and EOM are normal. Pupils are equal, round, and reactive to light. Right eye exhibits no chemosis, no discharge, no exudate and no hordeolum. Left eye exhibits no chemosis, no discharge, no exudate and no hordeolum. Right conjunctiva is not injected. Left conjunctiva is not injected. No scleral icterus. Right eye exhibits normal extraocular motion and no nystagmus. Left eye exhibits normal extraocular motion and no nystagmus. Pupils are equal.  Neck: Normal range of motion. Neck supple. No thyromegaly present.  Cardiovascular: Normal rate, regular rhythm and normal heart sounds.   No murmur heard. Pulmonary/Chest: Effort normal and breath sounds normal. No respiratory distress. She has no wheezes. She has no rales.  Abdominal: Soft. Bowel sounds are normal. She exhibits no distension. There is no tenderness.  Musculoskeletal: Normal range of motion.  Lymphadenopathy:    She has no cervical adenopathy.  Neurological: She is alert and oriented to person, place, and time.  Skin: Skin is warm and dry.  Psychiatric: She has a normal mood and affect. Her behavior is normal. Judgment and thought content normal.     Lab Results  Component Value Date   WBC 7.2 07/08/2015   HGB 12.7 08/22/2013   HCT 38.6 07/08/2015   PLT 266 07/08/2015   GLUCOSE  91 11/19/2015   CHOL 166 11/19/2015   TRIG 101 11/19/2015   HDL 54 11/19/2015   LDLCALC 92 11/19/2015   ALT 13 11/19/2015   AST 15 11/19/2015   NA 147* 11/19/2015   K 3.9 11/19/2015   CL 108* 11/19/2015   CREATININE 0.64 11/19/2015   BUN 15 11/19/2015   CO2 25 11/19/2015   TSH 2.040 07/08/2015    Ms Digital Screening  03/30/2012  *RADIOLOGY REPORT* Clinical Data: Screening. MAMMOGRAPHIC BILATERAL DIGITAL SCREENING WITH CAD  Findings: The breast tissue is heterogeneously dense.  No masses or malignant type calcifications are identified.  Compared with prior studies. Images were processed with CAD. IMPRESSION: No specific mammographic evidence of malignancy. A result letter of this screening mammogram will be mailed directly to the patient. RECOMMENDATION: Screening mammogram in one year. (Code:SM-B-01Y) BI-RADS CATEGORY 1:  Negative Original Report Authenticated By: Lura Em, M.D.   Assessment & Plan:   Krista Gonzales was seen today for vision changes.  Diagnoses and all orders for this visit:  Visual blurriness    Refer to E.D. For ophthalmology eval and MRI Brain  I am having Krista Gonzales maintain her multivitamin with minerals, Cholecalciferol (VITAMIN D PO), furosemide, amLODipine, and lisinopril.  No orders of the defined types were placed in this encounter.     Follow-up: Return if symptoms worsen or fail to improve.  Krista Gonzales, M.D.

## 2016-05-29 ENCOUNTER — Telehealth: Payer: Self-pay | Admitting: Nurse Practitioner

## 2016-05-29 NOTE — Telephone Encounter (Signed)
Denied.

## 2016-07-03 ENCOUNTER — Other Ambulatory Visit: Payer: Self-pay | Admitting: Nurse Practitioner

## 2016-07-03 DIAGNOSIS — I10 Essential (primary) hypertension: Secondary | ICD-10-CM

## 2016-07-29 ENCOUNTER — Other Ambulatory Visit: Payer: Self-pay | Admitting: Family Medicine

## 2016-07-29 DIAGNOSIS — Z1231 Encounter for screening mammogram for malignant neoplasm of breast: Secondary | ICD-10-CM

## 2016-07-31 ENCOUNTER — Ambulatory Visit
Admission: RE | Admit: 2016-07-31 | Discharge: 2016-07-31 | Disposition: A | Discharge status: Home / Self Care | Payer: BLUE CROSS/BLUE SHIELD | Source: Ambulatory Visit | Attending: Family Medicine | Admitting: Family Medicine

## 2016-07-31 DIAGNOSIS — Z1231 Encounter for screening mammogram for malignant neoplasm of breast: Secondary | ICD-10-CM

## 2016-08-05 ENCOUNTER — Encounter: Payer: Self-pay | Admitting: Family Medicine

## 2016-08-05 ENCOUNTER — Ambulatory Visit (INDEPENDENT_AMBULATORY_CARE_PROVIDER_SITE_OTHER): Payer: BLUE CROSS/BLUE SHIELD | Admitting: Family Medicine

## 2016-08-05 VITALS — BP 134/82 | HR 67 | Temp 98.1°F | Ht 64.0 in | Wt 184.5 lb

## 2016-08-05 DIAGNOSIS — I10 Essential (primary) hypertension: Secondary | ICD-10-CM | POA: Diagnosis not present

## 2016-08-05 DIAGNOSIS — R0683 Snoring: Secondary | ICD-10-CM

## 2016-08-05 DIAGNOSIS — G2581 Restless legs syndrome: Secondary | ICD-10-CM | POA: Diagnosis not present

## 2016-08-05 MED ORDER — LISINOPRIL 40 MG PO TABS: 40.0000 mg | ORAL_TABLET | Freq: Every day | ORAL | 0 refills | Status: DC

## 2016-08-05 MED ORDER — AMLODIPINE BESYLATE 5 MG PO TABS: 5.0000 mg | ORAL_TABLET | Freq: Every day | ORAL | 0 refills | Status: DC

## 2016-08-05 NOTE — Progress Notes (Signed)
BP 134/82   Pulse 67   Temp 98.1 F (36.7 C) (Oral)   Ht '5\' 4"'$  (1.626 m)   Wt 184 lb 8 oz (83.7 kg)   BMI 31.67 kg/m    Subjective:    Patient ID: Krista Gonzales, female    DOB: [birth year], 65 y.o.   MRN: [id number]  HPI: Krista Gonzales is a 65 y.o. female presenting on 08/05/2016 for Bilateral leg pain (legs ache from hips to ankles, began several months ago, wakes her up at night, throbbing pain) and Medication refills (thinks Amlodipine causes her to swell)   HPI Bilateral leg achiness Patient comes in today with complaints of bilateral leg achiness is been going on for the past couple months. Her leg achiness has been worse at night and in the evenings when she is relaxing. She does not notice it as much when she is up and moving around. The leg achiness goes down from her hips all the way down to her ankles in the muscles. She denies any major joint pain or weakness. She denies any fevers or chills. She does admit that she snores but does not know if she ever stops breathing when she snores. She denies any difficulty ambulating.  Hypertension refill Patient comes in today for refill on her blood pressure medications. Her blood pressure today is 134/82. Patient denies headaches, blurred vision, chest pains, shortness of breath, or weakness. Denies any side effects from medication and is content with current medication.   Relevant past medical, surgical, family and social history reviewed and updated as indicated. Interim medical history since our last visit reviewed. Allergies and medications reviewed and updated.  Review of Systems  Constitutional: Negative for chills and fever.  HENT: Negative for congestion, ear discharge and ear pain.   Eyes: Negative for redness and visual disturbance.  Respiratory: Negative for chest tightness and shortness of breath.   Cardiovascular: Negative for chest pain and leg swelling.  Genitourinary: Negative for difficulty urinating and  dysuria.  Musculoskeletal: Positive for myalgias. Negative for back pain and gait problem.  Skin: Negative for rash.  Neurological: Negative for light-headedness and headaches.  Psychiatric/Behavioral: Negative for agitation and behavioral problems.  All other systems reviewed and are negative.   Per HPI unless specifically indicated above     Medication List       Accurate as of 08/05/16  5:56 PM. Always use your most recent med list.          amLODipine 5 MG tablet Commonly known as:  NORVASC Take 1 tablet (5 mg total) by mouth daily.   furosemide 40 MG tablet Commonly known as:  LASIX Take 1 tablet (40 mg total) by mouth daily.   lisinopril 40 MG tablet Commonly known as:  PRINIVIL,ZESTRIL Take 1 tablet (40 mg total) by mouth daily.   multivitamin with minerals tablet Take 1 tablet by mouth daily.   VITAMIN D PO Take by mouth.          Objective:    BP 134/82   Pulse 67   Temp 98.1 F (36.7 C) (Oral)   Ht '5\' 4"'$  (1.626 m)   Wt 184 lb 8 oz (83.7 kg)   BMI 31.67 kg/m   Wt Readings from Last 3 Encounters:  08/05/16 184 lb 8 oz (83.7 kg)  02/03/16 182 lb 12.8 oz (82.9 kg)  11/19/15 183 lb (83 kg)    Physical Exam  Constitutional: She is oriented to person, place, and time. She  appears well-developed and well-nourished. No distress.  Eyes: Conjunctivae are normal.  Neck: Neck supple. No thyromegaly present.  Cardiovascular: Normal rate, regular rhythm, normal heart sounds and intact distal pulses.   No murmur heard. Pulmonary/Chest: Effort normal and breath sounds normal. No respiratory distress. She has no wheezes.  Musculoskeletal: Normal range of motion. She exhibits no edema, tenderness or deformity.  Lymphadenopathy:    She has no cervical adenopathy.  Neurological: She is alert and oriented to person, place, and time. She displays normal reflexes. No cranial nerve deficit. She exhibits normal muscle tone. Coordination normal.  Skin: Skin is warm  and dry. No rash noted. She is not diaphoretic.  Psychiatric: She has a normal mood and affect. Her behavior is normal.  Nursing note and vitals reviewed.     Assessment & Plan:   Problem List Items Addressed This Visit      Cardiovascular and Mediastinum   Hypertension   Relevant Medications   lisinopril (PRINIVIL,ZESTRIL) 40 MG tablet   amLODipine (NORVASC) 5 MG tablet    Other Visit Diagnoses    Restless legs    -  Primary   Relevant Orders   Ambulatory referral to Sleep Studies   CMP14+EGFR   TSH   VITAMIN D 25 Hydroxy (Vit-D Deficiency, Fractures)   Vitamin B12   Magnesium   Snoring       Relevant Orders   Ambulatory referral to Sleep Studies       Follow up plan: Return in about 4 weeks (around 09/02/2016), or if symptoms worsen or fail to improve, for Fasting labs and hypertension recheck.  Counseling provided for all of the vaccine components Orders Placed This Encounter  Procedures  . CMP14+EGFR  . TSH  . VITAMIN D 25 Hydroxy (Vit-D Deficiency, Fractures)  . Vitamin B12  . Magnesium  . Ambulatory referral to Sleep Studies    Caryl Pina, MD [facility] 08/05/2016, 5:56 PM

## 2016-08-06 ENCOUNTER — Telehealth: Payer: Self-pay | Admitting: Family Medicine

## 2016-08-06 NOTE — Telephone Encounter (Signed)
Patient aware.

## 2016-08-06 NOTE — Telephone Encounter (Signed)
Patient called stating that per her husband patient does not snore or lose her breath while sleeping.

## 2016-08-06 NOTE — Telephone Encounter (Signed)
I would still recommend a sleep study because of the possible restless leg syndrome to see if that's what really is. If she does not want to go that is her choice but I do still recommend

## 2016-08-07 LAB — CMP14+EGFR
ALBUMIN: 4.1 g/dL (ref 3.6–4.8)
ALK PHOS: 74 IU/L (ref 39–117)
ALT: 13 IU/L (ref 0–32)
AST: 18 IU/L (ref 0–40)
Albumin/Globulin Ratio: 1.9 (ref 1.2–2.2)
BILIRUBIN TOTAL: 0.2 mg/dL (ref 0.0–1.2)
BUN / CREAT RATIO: 29 — AB (ref 12–28)
BUN: 19 mg/dL (ref 8–27)
CHLORIDE: 104 mmol/L (ref 96–106)
CO2: 24 mmol/L (ref 18–29)
Calcium: 8.9 mg/dL (ref 8.7–10.3)
Creatinine, Ser: 0.65 mg/dL (ref 0.57–1.00)
GFR calc non Af Amer: 95 mL/min/{1.73_m2} (ref >59)
GFR, EST AFRICAN AMERICAN: 110 mL/min/{1.73_m2} (ref >59)
GLOBULIN, TOTAL: 2.2 g/dL (ref 1.5–4.5)
Glucose: 98 mg/dL (ref 65–99)
Potassium: 3.7 mmol/L (ref 3.5–5.2)
SODIUM: 144 mmol/L (ref 134–144)
TOTAL PROTEIN: 6.3 g/dL (ref 6.0–8.5)

## 2016-08-07 LAB — MAGNESIUM: MAGNESIUM: 2.4 mg/dL — AB (ref 1.6–2.3)

## 2016-08-07 LAB — VITAMIN B12: Vitamin B-12: 445 pg/mL (ref 211–946)

## 2016-08-07 LAB — VITAMIN D 25 HYDROXY (VIT D DEFICIENCY, FRACTURES): Vit D, 25-Hydroxy: 25.7 ng/mL — ABNORMAL LOW (ref 30.0–100.0)

## 2016-08-07 LAB — TSH: TSH: 1.98 u[IU]/mL (ref 0.450–4.500)

## 2016-08-17 ENCOUNTER — Ambulatory Visit: Payer: BLUE CROSS/BLUE SHIELD | Admitting: Family Medicine

## 2016-09-05 ENCOUNTER — Other Ambulatory Visit: Payer: Self-pay | Admitting: Family Medicine

## 2016-09-05 DIAGNOSIS — I10 Essential (primary) hypertension: Secondary | ICD-10-CM

## 2016-09-15 ENCOUNTER — Encounter: Payer: Self-pay | Admitting: Family

## 2016-09-15 ENCOUNTER — Ambulatory Visit (INDEPENDENT_AMBULATORY_CARE_PROVIDER_SITE_OTHER): Payer: BLUE CROSS/BLUE SHIELD | Admitting: Family

## 2016-09-15 VITALS — BP 151/96 | HR 69 | Temp 98.6°F | Ht 64.0 in | Wt 174.0 lb

## 2016-09-15 DIAGNOSIS — B373 Candidiasis of vulva and vagina: Secondary | ICD-10-CM

## 2016-09-15 DIAGNOSIS — R300 Dysuria: Secondary | ICD-10-CM

## 2016-09-15 DIAGNOSIS — B3731 Acute candidiasis of vulva and vagina: Secondary | ICD-10-CM

## 2016-09-15 LAB — URINALYSIS
BILIRUBIN UA: NEGATIVE
GLUCOSE, UA: NEGATIVE
KETONES UA: NEGATIVE
Nitrite, UA: NEGATIVE
PROTEIN UA: NEGATIVE
SPEC GRAV UA: 1.02 (ref 1.005–1.030)
Urobilinogen, Ur: 1 mg/dL (ref 0.2–1.0)
pH, UA: 6.5 (ref 5.0–7.5)

## 2016-09-15 MED ORDER — FLUCONAZOLE 150 MG PO TABS: 150.0000 mg | ORAL_TABLET | ORAL | 0 refills | Status: DC

## 2016-09-15 NOTE — Progress Notes (Signed)
   Subjective:    Patient ID: Krista Gonzales, female    DOB: Mar 10, 1951, 65 y.o.   MRN: [id number]  Dysuria   This is a new problem. The current episode started in the past 7 days. The problem occurs every urination. The problem has been gradually worsening. The quality of the pain is described as burning. The pain is at a severity of 10/10. The pain is moderate. Pertinent negatives include no discharge, flank pain, frequency, hematuria, hesitancy, nausea, urgency or vomiting. She has tried increased fluids for the symptoms. The treatment provided mild relief.  Vaginal Itching  The patient's primary symptoms include genital itching. The patient's pertinent negatives include no genital odor or vaginal discharge. This is a new problem. The current episode started in the past 7 days. The problem occurs constantly. The problem has been gradually worsening. The pain is moderate. Associated symptoms include dysuria. Pertinent negatives include no flank pain, frequency, hematuria, nausea, urgency or vomiting.      Review of Systems  Gastrointestinal: Negative for nausea and vomiting.  Genitourinary: Positive for dysuria. Negative for flank pain, frequency, hematuria, hesitancy, urgency and vaginal discharge.  All other systems reviewed and are negative.      Objective:   Physical Exam  Constitutional: She is oriented to person, place, and time. She appears well-developed and well-nourished. No distress.  HENT:  Head: Normocephalic and atraumatic.  Eyes: Pupils are equal, round, and reactive to light.  Neck: Normal range of motion. Neck supple. No thyromegaly present.  Cardiovascular: Normal rate, regular rhythm, normal heart sounds and intact distal pulses.   No murmur heard. Pulmonary/Chest: Effort normal and breath sounds normal. No respiratory distress. She has no wheezes.  Abdominal: Soft. Bowel sounds are normal. She exhibits no distension. There is no tenderness.  Genitourinary: There is  rash (yeast like rash) on the right labia. There is rash on the left labia.  Musculoskeletal: Normal range of motion. She exhibits no edema or tenderness.  Neurological: She is alert and oriented to person, place, and time. She has normal reflexes. No cranial nerve deficit.  Skin: Skin is warm and dry.  Psychiatric: She has a normal mood and affect. Her behavior is normal. Judgment and thought content normal.  Vitals reviewed.     BP (!) 151/90 (BP Location: Left Arm, Patient Position: Sitting, Cuff Size: Normal)   Pulse 69   Temp 98.6 F (37 C) (Oral)   Ht 5\' 4"  (1.626 m)   Wt 174 lb (78.9 kg)   BMI 29.87 kg/m      Assessment & Plan:  1. Dysuria - Urinalysis - Urine culture  2. Vagina, candidiasis -keep clean and dry -Do not scratch -Daily probiotic -OTC creams to help soothe  -RTO Prn - fluconazole (DIFLUCAN) 150 MG tablet; Take 1 tablet (150 mg total) by mouth every 3 (three) days.  Dispense: 3 tablet; Refill: 0  Evelina Dun, FNP

## 2016-09-15 NOTE — Patient Instructions (Signed)

## 2016-09-17 ENCOUNTER — Other Ambulatory Visit: Payer: Self-pay | Admitting: Family

## 2016-09-17 LAB — URINE CULTURE

## 2016-09-17 MED ORDER — SULFAMETHOXAZOLE-TRIMETHOPRIM 800-160 MG PO TABS: 1.0000 | ORAL_TABLET | Freq: Two times a day (BID) | ORAL | 0 refills | Status: DC

## 2016-09-20 ENCOUNTER — Other Ambulatory Visit: Payer: Self-pay | Admitting: Pediatrics

## 2016-09-20 DIAGNOSIS — B373 Candidiasis of vulva and vagina: Secondary | ICD-10-CM

## 2016-09-20 DIAGNOSIS — B3731 Acute candidiasis of vulva and vagina: Secondary | ICD-10-CM

## 2016-09-20 MED ORDER — FLUCONAZOLE 150 MG PO TABS: 150.0000 mg | ORAL_TABLET | Freq: Once | ORAL | 0 refills | Status: AC

## 2016-09-20 NOTE — Progress Notes (Signed)
Pt called after hours phone line. Has red, itchy, burning skin in groin after being on bactrim for UTI. Can try fluconazole PO, if not improving needs to be seen.

## 2016-09-23 ENCOUNTER — Encounter: Payer: Self-pay | Admitting: Pediatrics

## 2016-09-23 ENCOUNTER — Ambulatory Visit (INDEPENDENT_AMBULATORY_CARE_PROVIDER_SITE_OTHER): Payer: BLUE CROSS/BLUE SHIELD | Admitting: Pediatrics

## 2016-09-23 VITALS — BP 115/74 | HR 63 | Temp 97.1°F | Ht 64.0 in | Wt 173.0 lb

## 2016-09-23 DIAGNOSIS — R609 Edema, unspecified: Secondary | ICD-10-CM | POA: Diagnosis not present

## 2016-09-23 DIAGNOSIS — L299 Pruritus, unspecified: Secondary | ICD-10-CM

## 2016-09-23 DIAGNOSIS — R399 Unspecified symptoms and signs involving the genitourinary system: Secondary | ICD-10-CM

## 2016-09-23 DIAGNOSIS — L309 Dermatitis, unspecified: Secondary | ICD-10-CM | POA: Diagnosis not present

## 2016-09-23 DIAGNOSIS — I10 Essential (primary) hypertension: Secondary | ICD-10-CM | POA: Diagnosis not present

## 2016-09-23 LAB — URINALYSIS, COMPLETE
BILIRUBIN UA: NEGATIVE
GLUCOSE, UA: NEGATIVE
KETONES UA: NEGATIVE
NITRITE UA: NEGATIVE
Protein, UA: NEGATIVE
SPEC GRAV UA: 1.01 (ref 1.005–1.030)
Urobilinogen, Ur: 0.2 mg/dL (ref 0.2–1.0)
pH, UA: 5 (ref 5.0–7.5)

## 2016-09-23 LAB — MICROSCOPIC EXAMINATION: Renal Epithel, UA: NONE SEEN /hpf

## 2016-09-23 MED ORDER — LISINOPRIL 40 MG PO TABS: 40.0000 mg | ORAL_TABLET | Freq: Every day | ORAL | 3 refills | Status: DC

## 2016-09-23 MED ORDER — TRIAMCINOLONE ACETONIDE 0.1 % EX LOTN: 1.0000 "application " | TOPICAL_LOTION | Freq: Two times a day (BID) | CUTANEOUS | 2 refills | Status: DC

## 2016-09-23 MED ORDER — FUROSEMIDE 40 MG PO TABS: 40.0000 mg | ORAL_TABLET | Freq: Every day | ORAL | 1 refills | Status: DC

## 2016-09-25 NOTE — Progress Notes (Signed)
  Subjective:   Patient ID: Krista Gonzales, female    DOB: 01-Dec-1950, 65 y.o.   MRN: [id number] CC: groin itching HPI: Krista Gonzales is a 65 y.o. female presenting for groin Itching  Recently treated iwht fluconazole for possible yeast infection Did not help Says no vaginal itching or discharge but has irritation and itching in skin by thighs Has not noticed any rash Has not noticed anything that helps improve itching Present all the time No new soaps, detergents No fevers  Relevant past medical, surgical, family and social history reviewed. Allergies and medications reviewed and updated. History  Smoking Status  . Never Smoker  Smokeless Tobacco  . Never Used   ROS: Per HPI   Objective:    BP 115/74   Pulse 63   Temp 97.1 F (36.2 C) (Oral)   Ht 5\' 4"  (1.626 m)   Wt 173 lb (78.5 kg)   BMI 29.70 kg/m   Wt Readings from Last 3 Encounters:  09/23/16 173 lb (78.5 kg)  09/15/16 174 lb (78.9 kg)  08/05/16 184 lb 8 oz (83.7 kg)    Gen: NAD, alert, cooperative with exam, NCAT EYES: EOMI, no conjunctival injection, or no icterus CV: NRRR, normal S1/S2 Resp: CTABL, no wheezes, normal WOB Abd: +BS, soft, NTND.  Ext: No edema, warm Neuro: Alert and oriented Skin: pink rash b/l thighs, groin No satillite lesions, no breaks in the skin GU: Normal external female genitalia, normal internal exam, minimal fluid present vaginal vault   Assessment & Plan:  Lamara was seen today for groin itching.  Diagnoses and all orders for this visit:  Itching Dermatitis on exam Try topical steroid cream Does not appear to be candidal -     triamcinolone lotion (KENALOG) 0.1 %; Apply 1 application topically 2 (two) times daily. UTI symptoms UA normal -     Urinalysis, Complete  Peripheral edema Cont lasix, well ocntrolled -     furosemide (LASIX) 40 MG tablet; Take 1 tablet (40 mg total) by mouth daily.  Essential hypertension Well controlled, cont current meds. Orders: -      lisinopril (PRINIVIL,ZESTRIL) 40 MG tablet; Take 1 tablet (40 mg total) by mouth daily.   Other orders -     Microscopic Examination   Follow up plan: As scheduled Assunta Found, MD [place]

## 2016-10-07 ENCOUNTER — Ambulatory Visit: Payer: BLUE CROSS/BLUE SHIELD | Admitting: Pediatrics

## 2017-03-13 ENCOUNTER — Other Ambulatory Visit: Payer: Self-pay | Admitting: Pediatrics

## 2017-03-13 DIAGNOSIS — I10 Essential (primary) hypertension: Secondary | ICD-10-CM

## 2017-03-15 NOTE — Telephone Encounter (Signed)
Forward to MMM/PCP

## 2017-06-08 ENCOUNTER — Other Ambulatory Visit: Payer: Self-pay | Admitting: Nurse Practitioner

## 2017-06-08 DIAGNOSIS — I10 Essential (primary) hypertension: Secondary | ICD-10-CM

## 2017-06-09 NOTE — Telephone Encounter (Signed)
Last seen 09/23/16  Dr Evette Doffing  MMM PCP

## 2017-09-14 ENCOUNTER — Other Ambulatory Visit: Payer: Self-pay | Admitting: Nurse Practitioner

## 2017-09-14 DIAGNOSIS — Z1231 Encounter for screening mammogram for malignant neoplasm of breast: Secondary | ICD-10-CM

## 2017-10-18 ENCOUNTER — Ambulatory Visit: Payer: BLUE CROSS/BLUE SHIELD

## 2017-11-15 ENCOUNTER — Ambulatory Visit
Admission: RE | Admit: 2017-11-15 | Discharge: 2017-11-15 | Disposition: A | Discharge status: Home / Self Care | Payer: BLUE CROSS/BLUE SHIELD | Source: Ambulatory Visit | Attending: Nurse Practitioner | Admitting: Nurse Practitioner

## 2017-11-15 DIAGNOSIS — Z1231 Encounter for screening mammogram for malignant neoplasm of breast: Secondary | ICD-10-CM

## 2017-12-06 ENCOUNTER — Telehealth: Payer: Self-pay | Admitting: Family Medicine

## 2017-12-06 DIAGNOSIS — I10 Essential (primary) hypertension: Secondary | ICD-10-CM

## 2017-12-06 MED ORDER — LISINOPRIL 40 MG PO TABS: 40.0000 mg | ORAL_TABLET | Freq: Every day | ORAL | 1 refills | Status: DC

## 2017-12-06 NOTE — Telephone Encounter (Signed)
What is the name of the medication? lisinopril (PRINIVIL,ZESTRIL) 40 MG tablet  Have you contacted your pharmacy to request a refill? no  Which pharmacy would you like this sent to? [facility] in [place]   Patient notified that their request is being sent to the clinical staff for review and that they should receive a call once it is complete. If they do not receive a call within 24 hours they can check with their pharmacy or our office.

## 2017-12-06 NOTE — Telephone Encounter (Signed)
Prescription for Lisinopril sent, patient aware

## 2018-06-07 ENCOUNTER — Encounter: Payer: Self-pay | Admitting: Pediatrics

## 2018-06-07 ENCOUNTER — Ambulatory Visit: Payer: BLUE CROSS/BLUE SHIELD | Admitting: Pediatrics

## 2018-06-07 DIAGNOSIS — I10 Essential (primary) hypertension: Secondary | ICD-10-CM

## 2018-06-07 MED ORDER — LISINOPRIL 40 MG PO TABS: 40.0000 mg | ORAL_TABLET | Freq: Every day | ORAL | 1 refills | Status: DC

## 2018-06-07 MED ORDER — LISINOPRIL 40 MG PO TABS: 40.0000 mg | ORAL_TABLET | Freq: Every day | ORAL | 0 refills | Status: DC

## 2018-06-07 NOTE — Progress Notes (Signed)
  Subjective:   Patient ID: Krista Gonzales, female    DOB: [birth year], 67 y.o.   MRN: [id number] CC: Medical Management of Chronic Issues  HPI: Krista Gonzales is a 67 y.o. female   Has been out of her medicine for the last couple of days.  Has been doing well on her blood pressure medicine.  No lightheadedness, chest pain, shortness of breath.  Relevant past medical, surgical, family and social history reviewed. Allergies and medications reviewed and updated. Social History   Tobacco Use  Smoking Status Never Smoker  Smokeless Tobacco Never Used   ROS: Per HPI   Objective:    BP 140/86   Pulse 60   Temp 98.9 F (37.2 C) (Oral)   Ht 5\' 4"  (1.626 m)   Wt 180 lb (81.6 kg)   BMI 30.90 kg/m   Wt Readings from Last 3 Encounters:  06/07/18 180 lb (81.6 kg)  09/23/16 173 lb (78.5 kg)  09/15/16 174 lb (78.9 kg)    Gen: NAD, alert, cooperative with exam, NCAT EYES: EOMI, no conjunctival injection, or no icterus CV: NRRR, normal S1/S2, no murmur, distal pulses 2+ b/l Resp: CTABL, no wheezes, normal WOB Abd: +BS, soft, NTND. no guarding or organomegaly Ext: No edema, warm Neuro: Alert and oriented  Assessment & Plan:  Krista Gonzales was seen today for medical management of chronic issues.  Diagnoses and all orders for this visit:  Essential hypertension BP elevated today, restart lisinopril.  Let me know if regularly elevated. -     lisinopril (PRINIVIL,ZESTRIL) 40 MG tablet; Take 1 tablet (40 mg total) by mouth daily. -     Basic Metabolic Panel   Follow up plan: Return in about 6 months (around 12/08/2018). Assunta Found, MD [place]

## 2018-06-08 LAB — BASIC METABOLIC PANEL
BUN/Creatinine Ratio: 22 (ref 12–28)
BUN: 14 mg/dL (ref 8–27)
CALCIUM: 9.2 mg/dL (ref 8.7–10.3)
CHLORIDE: 106 mmol/L (ref 96–106)
CO2: 25 mmol/L (ref 20–29)
Creatinine, Ser: 0.64 mg/dL (ref 0.57–1.00)
GFR calc Af Amer: 109 mL/min/{1.73_m2} (ref >59)
GFR, EST NON AFRICAN AMERICAN: 95 mL/min/{1.73_m2} (ref >59)
GLUCOSE: 80 mg/dL (ref 65–99)
POTASSIUM: 3.7 mmol/L (ref 3.5–5.2)
Sodium: 147 mmol/L — ABNORMAL HIGH (ref 134–144)

## 2018-09-07 ENCOUNTER — Ambulatory Visit (INDEPENDENT_AMBULATORY_CARE_PROVIDER_SITE_OTHER): Payer: BLUE CROSS/BLUE SHIELD | Admitting: Pediatrics

## 2018-09-07 ENCOUNTER — Encounter: Payer: Self-pay | Admitting: Pediatrics

## 2018-09-07 VITALS — BP 137/89 | HR 60 | Temp 98.6°F | Ht 64.0 in | Wt 179.2 lb

## 2018-09-07 DIAGNOSIS — Z23 Encounter for immunization: Secondary | ICD-10-CM

## 2018-09-07 DIAGNOSIS — Z0000 Encounter for general adult medical examination without abnormal findings: Secondary | ICD-10-CM | POA: Diagnosis not present

## 2018-09-07 DIAGNOSIS — I10 Essential (primary) hypertension: Secondary | ICD-10-CM

## 2018-09-07 DIAGNOSIS — E559 Vitamin D deficiency, unspecified: Secondary | ICD-10-CM

## 2018-09-07 NOTE — Progress Notes (Signed)
  Subjective:   Patient ID: Krista Gonzales, female    DOB: 1951/03/13, 67 y.o.   MRN: [id number] CC: Annual Exam and Gynecologic Exam  HPI: Krista Gonzales is a 67 y.o. female   Feeling well overall.  No fevers, normal appetite.  Taking blood pressure medicine regularly.  No lightheadedness or dizziness.  No chest pain with exertion.  Relevant past medical, surgical, family and social history reviewed. Allergies and medications reviewed and updated. Social History   Tobacco Use  Smoking Status Never Smoker  Smokeless Tobacco Never Used   ROS: All systems negative other than what is in the HPI  Objective:    BP 137/89   Pulse 60   Temp 98.6 F (37 C) (Oral)   Ht 5\' 4"  (1.626 m)   Wt 179 lb 3.2 oz (81.3 kg)   BMI 30.76 kg/m   Wt Readings from Last 3 Encounters:  09/07/18 179 lb 3.2 oz (81.3 kg)  06/07/18 180 lb (81.6 kg)  09/23/16 173 lb (78.5 kg)    Gen: NAD, alert, cooperative with exam, NCAT EYES: EOMI, no conjunctival injection, or no icterus ENT:  TMs pearly gray b/l, OP without erythema LYMPH: no cervical LAD CV: NRRR, normal S1/S2, no murmur, distal pulses 2+ b/l Resp: CTABL, no wheezes, normal WOB Abd: +BS, soft, NTND. no guarding or organomegaly Ext: No edema, warm Neuro: Alert and oriented, strength equal b/l UE and LE, coordination grossly normal MSK: normal muscle bulk GU: normal appearing vaginal rugae, cuff intact, no cervix visible  Assessment & Plan:  Krista Gonzales was seen today for annual exam and gynecologic exam.  Diagnoses and all orders for this visit:  Encounter for preventive care BMI 30 Continue lifestyle changes, regular physical activity, decreasing sugary food intake. -     TSH -     Pap IG and HPV (high risk) DNA detection  Vitamin D deficiency Take daily over-the-counter vitamin D, will check levels. -     VITAMIN D 25 Hydroxy (Vit-D Deficiency, Fractures)  Essential hypertension Slightly elevated today, check at home when able, let me  know if persistently greater than 135 on top or greater than 85 on bottom.  Continue current medicine.  Need for immunization against influenza -     Flu Vaccine QUAD 36+ mos IM   Follow up plan: Return in about 6 months (around 03/09/2019). Krista Found, MD [place]

## 2018-09-08 LAB — TSH: TSH: 1.74 u[IU]/mL (ref 0.450–4.500)

## 2018-09-08 LAB — VITAMIN D 25 HYDROXY (VIT D DEFICIENCY, FRACTURES): Vit D, 25-Hydroxy: 21.1 ng/mL — ABNORMAL LOW (ref 30.0–100.0)

## 2018-09-11 LAB — PAP IG AND HPV HIGH-RISK: HPV, HIGH-RISK: NEGATIVE

## 2018-12-03 ENCOUNTER — Other Ambulatory Visit: Payer: Self-pay | Admitting: Pediatrics

## 2018-12-03 DIAGNOSIS — R609 Edema, unspecified: Secondary | ICD-10-CM

## 2018-12-03 DIAGNOSIS — I10 Essential (primary) hypertension: Secondary | ICD-10-CM

## 2018-12-24 ENCOUNTER — Telehealth: Payer: Self-pay | Admitting: Pediatrics

## 2018-12-26 NOTE — Telephone Encounter (Signed)
Patient states she is feeling some better at this time.  This encounter will now be closed

## 2019-01-02 ENCOUNTER — Ambulatory Visit
Admission: RE | Admit: 2019-01-02 | Discharge: 2019-01-02 | Disposition: A | Discharge status: Home / Self Care | Payer: Medicare Other | Source: Ambulatory Visit | Attending: Nurse Practitioner | Admitting: Nurse Practitioner

## 2019-01-02 ENCOUNTER — Other Ambulatory Visit: Payer: Self-pay | Admitting: Nurse Practitioner

## 2019-01-02 ENCOUNTER — Ambulatory Visit: Payer: BLUE CROSS/BLUE SHIELD

## 2019-01-02 DIAGNOSIS — Z1231 Encounter for screening mammogram for malignant neoplasm of breast: Secondary | ICD-10-CM

## 2019-05-28 ENCOUNTER — Other Ambulatory Visit: Payer: Self-pay | Admitting: Family Medicine

## 2019-05-28 DIAGNOSIS — I10 Essential (primary) hypertension: Secondary | ICD-10-CM

## 2019-06-07 ENCOUNTER — Other Ambulatory Visit: Payer: Self-pay

## 2019-06-08 ENCOUNTER — Ambulatory Visit (INDEPENDENT_AMBULATORY_CARE_PROVIDER_SITE_OTHER): Payer: Medicare Other | Admitting: Family Medicine

## 2019-06-08 ENCOUNTER — Encounter: Payer: Self-pay | Admitting: Family Medicine

## 2019-06-08 VITALS — BP 135/83 | HR 58 | Temp 98.6°F | Ht 64.0 in | Wt 178.0 lb

## 2019-06-08 DIAGNOSIS — Z780 Asymptomatic menopausal state: Secondary | ICD-10-CM

## 2019-06-08 DIAGNOSIS — Z114 Encounter for screening for human immunodeficiency virus [HIV]: Secondary | ICD-10-CM

## 2019-06-08 DIAGNOSIS — Z6831 Body mass index (BMI) 31.0-31.9, adult: Secondary | ICD-10-CM

## 2019-06-08 DIAGNOSIS — Z23 Encounter for immunization: Secondary | ICD-10-CM | POA: Diagnosis not present

## 2019-06-08 DIAGNOSIS — E559 Vitamin D deficiency, unspecified: Secondary | ICD-10-CM | POA: Diagnosis not present

## 2019-06-08 DIAGNOSIS — Z13820 Encounter for screening for osteoporosis: Secondary | ICD-10-CM

## 2019-06-08 DIAGNOSIS — E538 Deficiency of other specified B group vitamins: Secondary | ICD-10-CM

## 2019-06-08 DIAGNOSIS — R5383 Other fatigue: Secondary | ICD-10-CM

## 2019-06-08 DIAGNOSIS — I10 Essential (primary) hypertension: Secondary | ICD-10-CM

## 2019-06-08 DIAGNOSIS — R609 Edema, unspecified: Secondary | ICD-10-CM

## 2019-06-08 MED ORDER — LISINOPRIL 40 MG PO TABS: 40.0000 mg | ORAL_TABLET | Freq: Every day | ORAL | 1 refills | Status: DC

## 2019-06-08 MED ORDER — FUROSEMIDE 40 MG PO TABS: 40.0000 mg | ORAL_TABLET | Freq: Every day | ORAL | 3 refills | Status: DC

## 2019-06-08 NOTE — Patient Instructions (Signed)

## 2019-06-08 NOTE — Progress Notes (Signed)
Subjective:  Patient ID: Krista Gonzales, female    DOB: Sep 29, 1951, 68 y.o.   MRN: [id number]  Patient Care Team: [name], FNP as PCP - General (Family Medicine)   Chief Complaint:  Medical Management of Chronic Issues and Hypertension   HPI: Krista Gonzales is a 68 y.o. female presenting on 06/08/2019 for Medical Management of Chronic Issues and Hypertension   1. Essential hypertension  Complaint with meds - Yes Checking BP at home - No Exercising Regularly - Yes Watching Salt intake - Yes Pertinent ROS:  Headache - Yes Chest pain - No Dyspnea - No Palpitations - No LE edema - Yes They report good compliance with medications and can restate their regimen by memory. No medication side effects.  BP Readings from Last 3 Encounters:  06/08/19 135/83  09/07/18 137/89  06/07/18 140/86     2. Vitamin D deficiency  Not on repletion therapy. No muscle weakness, arthralgias, or recent fractures. Does have ongoing fatigue. States this has been ongoing for several months.    7. Peripheral edema  Bilateral lower extremity edema. Worse in the evenings and improves when legs are elevated. No shortness of breath or chest pain. No syncope, dizziness, or palpitations.    8. B12 deficiency  Not on repletion therapy. States B12 was low in the past. Ongoing fatigue for several months.      Relevant past medical, surgical, family, and social history reviewed and updated as indicated.  Allergies and medications reviewed and updated. Date reviewed: Chart in Epic.   Past Medical History:  Diagnosis Date  . Hypertension     Past Surgical History:  Procedure Laterality Date  . ABDOMINAL HYSTERECTOMY    . BREAST EXCISIONAL BIOPSY Right   . GALLBLADDER SURGERY    . HERNIA REPAIR    . TUMOR REMOVAL      Social History   Socioeconomic History  . Marital status: Married    Spouse name: Not on file  . Number of children: Not on file  . Years of education: Not on  file  . Highest education level: Not on file  Occupational History  . Not on file  Social Needs  . Financial resource strain: Not on file  . Food insecurity    Worry: Not on file    Inability: Not on file  . Transportation needs    Medical: Not on file    Non-medical: Not on file  Tobacco Use  . Smoking status: Never Smoker  . Smokeless tobacco: Never Used  Substance and Sexual Activity  . Alcohol use: No  . Drug use: No  . Sexual activity: Not on file  Lifestyle  . Physical activity    Days per week: Not on file    Minutes per session: Not on file  . Stress: Not on file  Relationships  . Social Herbalist on phone: Not on file    Gets together: Not on file    Attends religious service: Not on file    Active member of club or organization: Not on file    Attends meetings of clubs or organizations: Not on file    Relationship status: Not on file  . Intimate partner violence    Fear of current or ex partner: Not on file    Emotionally abused: Not on file    Physically abused: Not on file    Forced sexual activity: Not on file  Other Topics Concern  .  Not on file  Social History Narrative  . Not on file    Outpatient Encounter Medications as of 06/08/2019  Medication Sig  . furosemide (LASIX) 40 MG tablet Take 1 tablet (40 mg total) by mouth daily.  Marland Kitchen lisinopril (ZESTRIL) 40 MG tablet Take 1 tablet (40 mg total) by mouth daily.  . Multiple Vitamins-Minerals (MULTIVITAMIN WITH MINERALS) tablet Take 1 tablet by mouth daily.  . [DISCONTINUED] furosemide (LASIX) 40 MG tablet TAKE 1 TABLET BY MOUTH EVERY DAY  . [DISCONTINUED] lisinopril (ZESTRIL) 40 MG tablet TAKE 1 TABLET BY MOUTH EVERY DAY   No facility-administered encounter medications on file as of 06/08/2019.     No Known Allergies  Review of Systems  Constitutional: Positive for fatigue. Negative for activity change, appetite change, chills, diaphoresis, fever and unexpected weight change.  Eyes:  Negative for photophobia and visual disturbance.  Respiratory: Negative for cough, chest tightness, shortness of breath and wheezing.   Cardiovascular: Positive for leg swelling. Negative for chest pain and palpitations.  Gastrointestinal: Negative for abdominal distention, abdominal pain, anal bleeding, blood in stool, constipation, diarrhea, nausea, rectal pain and vomiting.  Endocrine: Negative for cold intolerance, heat intolerance, polydipsia, polyphagia and polyuria.  Genitourinary: Negative for decreased urine volume and difficulty urinating.  Musculoskeletal: Negative for arthralgias and myalgias.  Skin: Negative for color change and pallor.  Neurological: Positive for headaches. Negative for dizziness, tremors, seizures, syncope, facial asymmetry, speech difficulty, weakness, light-headedness and numbness.  Psychiatric/Behavioral: Negative for confusion.  All other systems reviewed and are negative.       Objective:  BP 135/83 (BP Location: Left Arm, Cuff Size: Normal)   Pulse (!) 58   Temp 98.6 F (37 C)   Ht _0  (1.626 m)   Wt 178 lb (80.7 kg)   BMI 30.55 kg/m    Wt Readings from Last 3 Encounters:  06/08/19 178 lb (80.7 kg)  09/07/18 179 lb 3.2 oz (81.3 kg)  06/07/18 180 lb (81.6 kg)    Physical Exam Vitals signs and nursing note reviewed.  Constitutional:      General: She is not in acute distress.    Appearance: Normal appearance. She is well-developed and well-groomed. She is obese. She is not ill-appearing, toxic-appearing or diaphoretic.  HENT:     Head: Normocephalic and atraumatic.     Jaw: There is normal jaw occlusion.     Right Ear: Hearing normal.     Left Ear: Hearing normal.     Nose: Nose normal.     Mouth/Throat:     Lips: Pink.     Mouth: Mucous membranes are moist.     Pharynx: Oropharynx is clear. Uvula midline.  Eyes:     General: Lids are normal.     Extraocular Movements: Extraocular movements intact.     Conjunctiva/sclera:  Conjunctivae normal.     Pupils: Pupils are equal, round, and reactive to light.  Neck:     Musculoskeletal: Normal range of motion and neck supple.     Thyroid: No thyroid mass, thyromegaly or thyroid tenderness.     Vascular: No carotid bruit or JVD.     Trachea: Trachea and phonation normal.  Cardiovascular:     Rate and Rhythm: Normal rate and regular rhythm.     Chest Wall: PMI is not displaced.     Pulses: Normal pulses.     Heart sounds: Normal heart sounds. No murmur. No friction rub. No gallop.   Pulmonary:     Effort: Pulmonary  effort is normal. No respiratory distress.     Breath sounds: Normal breath sounds. No wheezing.  Abdominal:     General: Bowel sounds are normal. There is no distension or abdominal bruit.     Palpations: Abdomen is soft. There is no hepatomegaly or splenomegaly.     Tenderness: There is no abdominal tenderness. There is no right CVA tenderness or left CVA tenderness.     Hernia: No hernia is present.  Musculoskeletal: Normal range of motion.     Right lower leg: 1+ Edema present.     Left lower leg: 1+ Edema present.  Lymphadenopathy:     Cervical: No cervical adenopathy.  Skin:    General: Skin is warm and dry.     Capillary Refill: Capillary refill takes less than 2 seconds.     Coloration: Skin is not cyanotic, jaundiced or pale.     Findings: No rash.  Neurological:     General: No focal deficit present.     Mental Status: She is alert and oriented to person, place, and time.     Cranial Nerves: Cranial nerves are intact.     Sensory: Sensation is intact.     Motor: Motor function is intact.     Coordination: Coordination is intact.     Gait: Gait is intact.     Deep Tendon Reflexes: Reflexes are normal and symmetric.  Psychiatric:        Attention and Perception: Attention and perception normal.        Mood and Affect: Mood and affect normal.        Speech: Speech normal.        Behavior: Behavior normal. Behavior is cooperative.         Thought Content: Thought content normal.        Cognition and Memory: Cognition and memory normal.        Judgment: Judgment normal.     Results for orders placed or performed in visit on 09/07/18  TSH  Result Value Ref Range   TSH 1.740 0.450 - 4.500 uIU/mL  VITAMIN D 25 Hydroxy (Vit-D Deficiency, Fractures)  Result Value Ref Range   Vit D, 25-Hydroxy 21.1 (L) 30.0 - 100.0 ng/mL  Pap IG and HPV (high risk) DNA detection  Result Value Ref Range   Interpretation NILM    Category NIL    Adequacy ENDO    Clinician Provided ICD10 Comment    Performed by: Comment    Note: Comment    Test Methodology Comment    HPV, high-risk Negative Negative       Pertinent labs & imaging results that were available during my care of the patient were reviewed by me and considered in my medical decision making.  Assessment & Plan:  Krista Gonzales was seen today for medical management of chronic issues and hypertension.  Diagnoses and all orders for this visit:  Essential hypertension DASH diet and exercise encouraged. Labs pending. May initiate additional antihypertensive warranted. Report any persistent high or low readings.  -     CMP14+EGFR -     CBC with Differential/Platelet -     Microalbumin / creatinine urine ratio -     Lipid panel -     Thyroid Panel With TSH -     lisinopril (ZESTRIL) 40 MG tablet; Take 1 tablet (40 mg total) by mouth daily. -     furosemide (LASIX) 40 MG tablet; Take 1 tablet (40 mg total) by mouth daily.  Vitamin  D deficiency Labs pending. Will restart oral repletion therapy if warranted.  -     Vitamin B12 -     VITAMIN D 25 Hydroxy (Vit-D Deficiency, Fractures)  BMI 31.0-31.9,adult Diet and exercise encouraged. Labs pending.  -     CMP14+EGFR -     Thyroid Panel With TSH -     Vitamin B12 -     VITAMIN D 25 Hydroxy (Vit-D Deficiency, Fractures)  Other fatigue Ongoing fatigue for several months. Will check below. Sleep hygiene discussed.  -     Thyroid  Panel With TSH -     Vitamin B12 -     VITAMIN D 25 Hydroxy (Vit-D Deficiency, Fractures)  Encounter for osteoporosis screening in asymptomatic postmenopausal patient Postmenopausal. On multivitamin daily. Will order DEXA today. -     DG [facility] DEXA  Screening for HIV (human immunodeficiency virus) -     HIV Antibody (routine testing w rflx)  Peripheral edema Continue lasix as prescribed. Report any new or worsening symptoms.  -     furosemide (LASIX) 40 MG tablet; Take 1 tablet (40 mg total) by mouth daily.  B12 deficiency Labs pending. Will initiation repletion therapy if warranted -     Vitamin B12  Encounter for administration of vaccine -     Pneumococcal conjugate vaccine 13-valent     Continue all other maintenance medications.  Follow up plan: Return in about 4 weeks (around 07/06/2019), or if symptoms worsen or fail to improve, for HTN.  Educational handout given for DASH diet  The above assessment and management plan was discussed with the patient. The patient verbalized understanding of and has agreed to the management plan. Patient is aware to call the clinic if symptoms persist or worsen. Patient is aware when to return to the clinic for a follow-up visit. Patient educated on when it is appropriate to go to the emergency department.   Monia Pouch, FNP-C [facility] [phone] 06/08/19

## 2019-06-09 ENCOUNTER — Other Ambulatory Visit: Payer: Self-pay | Admitting: Family Medicine

## 2019-06-09 DIAGNOSIS — R609 Edema, unspecified: Secondary | ICD-10-CM

## 2019-06-09 DIAGNOSIS — I10 Essential (primary) hypertension: Secondary | ICD-10-CM

## 2019-06-09 LAB — THYROID PANEL WITH TSH
Free Thyroxine Index: 1.5 (ref 1.2–4.9)
T3 Uptake Ratio: 23 % — ABNORMAL LOW (ref 24–39)
T4, Total: 6.7 ug/dL (ref 4.5–12.0)
TSH: 1.69 u[IU]/mL (ref 0.450–4.500)

## 2019-06-09 LAB — LIPID PANEL
Chol/HDL Ratio: 3.1 ratio (ref 0.0–4.4)
Cholesterol, Total: 165 mg/dL (ref 100–199)
HDL: 54 mg/dL (ref >39)
LDL Calculated: 90 mg/dL (ref 0–99)
Triglycerides: 105 mg/dL (ref 0–149)
VLDL Cholesterol Cal: 21 mg/dL (ref 5–40)

## 2019-06-09 LAB — CBC WITH DIFFERENTIAL/PLATELET
Basophils Absolute: 0.1 10*3/uL (ref 0.0–0.2)
Basos: 1 %
EOS (ABSOLUTE): 0.4 10*3/uL (ref 0.0–0.4)
Eos: 7 %
Hematocrit: 42 % (ref 34.0–46.6)
Hemoglobin: 13.7 g/dL (ref 11.1–15.9)
Immature Grans (Abs): 0 10*3/uL (ref 0.0–0.1)
Immature Granulocytes: 0 %
Lymphocytes Absolute: 1.3 10*3/uL (ref 0.7–3.1)
Lymphs: 22 %
MCH: 29.8 pg (ref 26.6–33.0)
MCHC: 32.6 g/dL (ref 31.5–35.7)
MCV: 91 fL (ref 79–97)
Monocytes Absolute: 0.4 10*3/uL (ref 0.1–0.9)
Monocytes: 7 %
Neutrophils Absolute: 3.9 10*3/uL (ref 1.4–7.0)
Neutrophils: 63 %
Platelets: 255 10*3/uL (ref 150–450)
RBC: 4.6 x10E6/uL (ref 3.77–5.28)
RDW: 12.8 % (ref 11.7–15.4)
WBC: 6.1 10*3/uL (ref 3.4–10.8)

## 2019-06-09 LAB — CMP14+EGFR
ALT: 14 IU/L (ref 0–32)
AST: 16 IU/L (ref 0–40)
Albumin/Globulin Ratio: 1.9 (ref 1.2–2.2)
Albumin: 4.2 g/dL (ref 3.8–4.8)
Alkaline Phosphatase: 74 IU/L (ref 39–117)
BUN/Creatinine Ratio: 28 (ref 12–28)
BUN: 19 mg/dL (ref 8–27)
Bilirubin Total: 0.3 mg/dL (ref 0.0–1.2)
CO2: 24 mmol/L (ref 20–29)
Calcium: 9.2 mg/dL (ref 8.7–10.3)
Chloride: 106 mmol/L (ref 96–106)
Creatinine, Ser: 0.69 mg/dL (ref 0.57–1.00)
GFR calc Af Amer: 106 mL/min/{1.73_m2} (ref >59)
GFR calc non Af Amer: 92 mL/min/{1.73_m2} (ref >59)
Globulin, Total: 2.2 g/dL (ref 1.5–4.5)
Glucose: 87 mg/dL (ref 65–99)
Potassium: 4.3 mmol/L (ref 3.5–5.2)
Sodium: 145 mmol/L — ABNORMAL HIGH (ref 134–144)
Total Protein: 6.4 g/dL (ref 6.0–8.5)

## 2019-06-09 LAB — MICROALBUMIN / CREATININE URINE RATIO
Creatinine, Urine: 22.5 mg/dL
Microalb/Creat Ratio: <13 mg/g creat (ref 0–29)
Microalbumin, Urine: <3 ug/mL

## 2019-06-09 LAB — VITAMIN B12: Vitamin B-12: 434 pg/mL (ref 232–1245)

## 2019-06-09 LAB — VITAMIN D 25 HYDROXY (VIT D DEFICIENCY, FRACTURES): Vit D, 25-Hydroxy: 25.8 ng/mL — ABNORMAL LOW (ref 30.0–100.0)

## 2019-06-09 LAB — HIV ANTIBODY (ROUTINE TESTING W REFLEX): HIV Screen 4th Generation wRfx: NONREACTIVE

## 2019-06-09 MED ORDER — FUROSEMIDE 40 MG PO TABS: 40.0000 mg | ORAL_TABLET | Freq: Two times a day (BID) | ORAL | 3 refills | Status: DC

## 2019-06-09 NOTE — Progress Notes (Signed)
Pt aware of dose change and need for repeat CMP and BP check in 2 weeks

## 2019-06-26 ENCOUNTER — Other Ambulatory Visit: Payer: Self-pay

## 2019-06-26 ENCOUNTER — Ambulatory Visit: Payer: Medicare Other | Admitting: *Deleted

## 2019-06-26 DIAGNOSIS — Z0130 Encounter for examination of blood pressure without abnormal findings: Secondary | ICD-10-CM

## 2019-06-26 NOTE — Progress Notes (Signed)
Pt here for BP check BP 108 71 P 76

## 2019-07-12 ENCOUNTER — Other Ambulatory Visit: Payer: Self-pay

## 2019-07-13 ENCOUNTER — Ambulatory Visit (INDEPENDENT_AMBULATORY_CARE_PROVIDER_SITE_OTHER): Payer: Medicare Other | Admitting: Family Medicine

## 2019-07-13 ENCOUNTER — Encounter: Payer: Self-pay | Admitting: Family Medicine

## 2019-07-13 VITALS — BP 111/71 | HR 63 | Temp 97.7°F | Ht 64.0 in | Wt 169.0 lb

## 2019-07-13 DIAGNOSIS — I10 Essential (primary) hypertension: Secondary | ICD-10-CM | POA: Diagnosis not present

## 2019-07-13 NOTE — Progress Notes (Signed)
Patient ID: Krista Gonzales, female    DOB: 1951-02-04, 68 y.o.   MRN: [id number]  Chief Complaint:  Medical Management of Chronic Issues (4 week )   HPI: Krista Gonzales is a 68 y.o. female presenting on 07/13/2019 for Medical Management of Chronic Issues (4 week )   1. Essential hypertension   Complaint with meds - Yes Checking BP at home ranging 118/60 Exercising Regularly - Yes Watching Salt intake - Yes Pertinent ROS:  Headache - No Chest pain - No Dyspnea - No Palpitations - No LE edema - No  They report good compliance with medications and can restate their regimen by memory. No medication side effects  PMH: Smoking status noted  Review of Systems  Constitutional: Negative for activity change, appetite change, chills, fatigue and fever.  HENT: Negative.   Eyes: Negative.  Negative for photophobia and visual disturbance.  Respiratory: Negative for cough, chest tightness and shortness of breath.   Cardiovascular: Negative for chest pain, palpitations and leg swelling.  Gastrointestinal: Negative for blood in stool, constipation, diarrhea, nausea and vomiting.  Endocrine: Negative.   Genitourinary: Negative for decreased urine volume, difficulty urinating, dysuria, frequency and urgency.  Musculoskeletal: Negative for arthralgias and myalgias.  Skin: Negative.   Allergic/Immunologic: Negative.   Neurological: Negative for dizziness, tremors, seizures, syncope, facial asymmetry, speech difficulty, weakness, light-headedness, numbness and headaches.  Hematological: Negative.   Psychiatric/Behavioral: Negative for confusion, hallucinations, sleep disturbance and suicidal ideas.  All other systems reviewed and are negative.        BP 111/71   Pulse 63   Temp 97.7 F (36.5 C)   Ht _0  (1.626 m)   Wt 169 lb (76.7 kg)   BMI 29.01 kg/m   Gen: NAD, alert, cooperative with exam HEENT: NCAT, EOMI, PERRL CV: RRR, good S1/S2, no murmur Resp: CTABL, no  wheezes, non-labored Abd: SNTND, BS present, no guarding or organomegaly Ext: No edema, warm Neuro: Alert and oriented, No gross deficits    Assessment and Plan: Krista Gonzales was seen today for medical management of chronic issues.  Diagnoses and all orders for this visit:  Essential hypertension BP well controlled. Changes were made in regimen today.Pt has only been taking Lasix 40 mg daily and watching her salt intake. Great improvement in BP since above changes. Due to great BP control, can trial 20 mg Lasix daily. If BP goes up, can restart 40 mg Lasix daily. Daily blood pressure log given with instructions on how to fill out and told to bring to next visit. Gaol BP 130/80. Pt aware to report any persistent high or low readings. DASH diet and exercise encouraged. Exercise at least 150 minutes per week and increase as tolerated. Goal BMI > 25. Stress management encouraged. Smoking cessation discussed. Avoid excessive alcohol. Avoid NSAID's. Avoid more than 2000 mg of sodium daily. Medications as prescribed. Follow up as scheduled.  [facility]    Educational handout given for Hypertension, DASH diet  The above assessment and management plan was discussed with the patient. The patient verbalized understanding of and has agreed to the management plan. Patient is aware to call the clinic if symptoms persist or worsen. Patient is aware when to return to the clinic for a follow-up visit. Patient educated on when it is appropriate to go to the emergency department.   Monia Pouch, FNP-C [facility] [phone]

## 2019-07-13 NOTE — Patient Instructions (Signed)
DASH Eating Plan DASH stands for "Dietary Approaches to Stop Hypertension." The DASH eating plan is a healthy eating plan that has been shown to reduce high blood pressure (hypertension). Additional health benefits may include reducing the risk of type 2 diabetes mellitus, heart disease, and stroke. The DASH eating plan may also help with weight loss.  WHAT DO I NEED TO KNOW ABOUT THE DASH EATING PLAN? For the DASH eating plan, you will follow these general guidelines:  Choose foods with a percent daily value for sodium of less than 5% (as listed on the food label).  Use salt-free seasonings or herbs instead of table salt or sea salt.  Check with your health care provider or pharmacist before using salt substitutes.  Eat lower-sodium products, often labeled as "lower sodium" or "no salt added."  Eat fresh foods.  Eat more vegetables, fruits, and low-fat dairy products.  Choose whole grains. Look for the word "whole" as the first word in the ingredient list.  Choose fish and skinless chicken or turkey more often than red meat. Limit fish, poultry, and meat to 6 oz (170 g) each day.  Limit sweets, desserts, sugars, and sugary drinks.  Choose heart-healthy fats.  Limit cheese to 1 oz (28 g) per day.  Eat more home-cooked food and less restaurant, buffet, and fast food.  Limit fried foods.  Cook foods using methods other than frying.  Limit canned vegetables. If you do use them, rinse them well to decrease the sodium.  When eating at a restaurant, ask that your food be prepared with less salt, or no salt if possible.  WHAT FOODS CAN I EAT? Seek help from a dietitian for individual calorie needs.  Grains Whole grain or whole wheat bread. Brown rice. Whole grain or whole wheat pasta. Quinoa, bulgur, and whole grain cereals. Low-sodium cereals. Corn or whole wheat flour tortillas. Whole grain cornbread. Whole grain crackers. Low-sodium crackers.  Vegetables Fresh or frozen  vegetables (raw, steamed, roasted, or grilled). Low-sodium or reduced-sodium tomato and vegetable juices. Low-sodium or reduced-sodium tomato sauce and paste. Low-sodium or reduced-sodium canned vegetables.   Fruits All fresh, canned (in natural juice), or frozen fruits.  Meat and Other Protein Products Ground beef (85% or leaner), grass-fed beef, or beef trimmed of fat. Skinless chicken or turkey. Ground chicken or turkey. Pork trimmed of fat. All fish and seafood. Eggs. Dried beans, peas, or lentils. Unsalted nuts and seeds. Unsalted canned beans.  Dairy Low-fat dairy products, such as skim or 1% milk, 2% or reduced-fat cheeses, low-fat ricotta or cottage cheese, or plain low-fat yogurt. Low-sodium or reduced-sodium cheeses.  Fats and Oils Tub margarines without trans fats. Light or reduced-fat mayonnaise and salad dressings (reduced sodium). Avocado. Safflower, olive, or canola oils. Natural peanut or almond butter.  Other Unsalted popcorn and pretzels. The items listed above may not be a complete list of recommended foods or beverages. Contact your dietitian for more options.  WHAT FOODS ARE NOT RECOMMENDED?  Grains White bread. White pasta. White rice. Refined cornbread. Bagels and croissants. Crackers that contain trans fat.  Vegetables Creamed or fried vegetables. Vegetables in a cheese sauce. Regular canned vegetables. Regular canned tomato sauce and paste. Regular tomato and vegetable juices.  Fruits Dried fruits. Canned fruit in light or heavy syrup. Fruit juice.  Meat and Other Protein Products Fatty cuts of meat. Ribs, chicken wings, bacon, sausage, bologna, salami, chitterlings, fatback, hot dogs, bratwurst, and packaged luncheon meats. Salted nuts and seeds. Canned beans with salt.    Dairy Whole or 2% milk, cream, half-and-half, and cream cheese. Whole-fat or sweetened yogurt. Full-fat cheeses or blue cheese. Nondairy creamers and whipped toppings. Processed cheese,  cheese spreads, or cheese curds.  Condiments Onion and garlic salt, seasoned salt, table salt, and sea salt. Canned and packaged gravies. Worcestershire sauce. Tartar sauce. Barbecue sauce. Teriyaki sauce. Soy sauce, including reduced sodium. Steak sauce. Fish sauce. Oyster sauce. Cocktail sauce. Horseradish. Ketchup and mustard. Meat flavorings and tenderizers. Bouillon cubes. Hot sauce. Tabasco sauce. Marinades. Taco seasonings. Relishes.  Fats and Oils Butter, stick margarine, lard, shortening, ghee, and bacon fat. Coconut, palm kernel, or palm oils. Regular salad dressings.  Other Pickles and olives. Salted popcorn and pretzels.  The items listed above may not be a complete list of foods and beverages to avoid. Contact your dietitian for more information.  WHERE CAN I FIND MORE INFORMATION? National Heart, Lung, and [employer] Institute: www.nhlbi.nih.gov/health/health-topics/topics/dash/ Document Released: 10/15/2011 Document Revised: 03/12/2014 Document Reviewed: 08/30/2013 Exit[employer] Patient Information 2015 [employer]. This information is not intended to replace advice given to you by your health care provider. Make sure you discuss any questions you have with your health care provider.   I think that you would greatly benefit from seeing a nutritionist.  If you are interested, please call Dr Sykes at [phone] to schedule an appointment.   

## 2019-07-14 LAB — BMP8+EGFR
BUN/Creatinine Ratio: 24 (ref 12–28)
BUN: 22 mg/dL (ref 8–27)
CO2: 25 mmol/L (ref 20–29)
Calcium: 9.2 mg/dL (ref 8.7–10.3)
Chloride: 108 mmol/L — ABNORMAL HIGH (ref 96–106)
Creatinine, Ser: 0.91 mg/dL (ref 0.57–1.00)
GFR calc Af Amer: 77 mL/min/{1.73_m2} (ref >59)
GFR calc non Af Amer: 66 mL/min/{1.73_m2} (ref >59)
Glucose: 91 mg/dL (ref 65–99)
Potassium: 4.6 mmol/L (ref 3.5–5.2)
Sodium: 145 mmol/L — ABNORMAL HIGH (ref 134–144)

## 2019-07-24 ENCOUNTER — Encounter: Payer: Medicare Other | Admitting: Family Medicine

## 2019-11-01 ENCOUNTER — Other Ambulatory Visit: Payer: Self-pay

## 2019-11-01 ENCOUNTER — Ambulatory Visit (INDEPENDENT_AMBULATORY_CARE_PROVIDER_SITE_OTHER): Payer: Medicare Other | Admitting: Family Medicine

## 2019-11-01 ENCOUNTER — Encounter: Payer: Self-pay | Admitting: Family Medicine

## 2019-11-01 VITALS — BP 110/65 | HR 61 | Temp 97.8°F | Resp 20 | Ht 64.0 in | Wt 159.0 lb

## 2019-11-01 DIAGNOSIS — E559 Vitamin D deficiency, unspecified: Secondary | ICD-10-CM | POA: Diagnosis not present

## 2019-11-01 DIAGNOSIS — E782 Mixed hyperlipidemia: Secondary | ICD-10-CM

## 2019-11-01 DIAGNOSIS — Z23 Encounter for immunization: Secondary | ICD-10-CM | POA: Diagnosis not present

## 2019-11-01 DIAGNOSIS — R609 Edema, unspecified: Secondary | ICD-10-CM | POA: Diagnosis not present

## 2019-11-01 DIAGNOSIS — E538 Deficiency of other specified B group vitamins: Secondary | ICD-10-CM | POA: Diagnosis not present

## 2019-11-01 DIAGNOSIS — Z6831 Body mass index (BMI) 31.0-31.9, adult: Secondary | ICD-10-CM | POA: Diagnosis not present

## 2019-11-01 DIAGNOSIS — I10 Essential (primary) hypertension: Secondary | ICD-10-CM | POA: Diagnosis not present

## 2019-11-01 MED ORDER — FUROSEMIDE 40 MG PO TABS: 40.0000 mg | ORAL_TABLET | Freq: Two times a day (BID) | ORAL | 1 refills | Status: DC

## 2019-11-01 MED ORDER — VITAMIN D (ERGOCALCIFEROL) 1.25 MG (50000 UNIT) PO CAPS: 50000.0000 [IU] | ORAL_CAPSULE | ORAL | 2 refills | Status: DC

## 2019-11-01 MED ORDER — LISINOPRIL 40 MG PO TABS: 40.0000 mg | ORAL_TABLET | Freq: Every day | ORAL | 1 refills | Status: DC

## 2019-11-01 NOTE — Progress Notes (Signed)
Subjective:  Patient ID: Krista Gonzales, female    DOB: 1951/09/12, 68 y.o.   MRN: [id number]  Patient Care Team: [name], FNP as PCP - General (Family Medicine)   Chief Complaint:  Medical Management of Chronic Issues (3 mo ) and Hypertension   HPI: [facility] Krista Gonzales is a 68 y.o. female presenting on 11/01/2019 for Medical Management of Chronic Issues (3 mo ) and Hypertension   1. Essential hypertension Complaint with meds - Yes Current Medications - Zestril Checking BP at home - No Exercising Regularly - No Watching Salt intake - Yes Pertinent ROS:  Headache - No Fatigue - No Visual Disturbances - No Chest pain - No Dyspnea - No Palpitations - No LE edema - Yes They report good compliance with medications and can restate their regimen by memory. No medication side effects.  Family, social, and smoking history reviewed.   BP Readings from Last 3 Encounters:  11/01/19 110/65  07/13/19 111/71  06/08/19 135/83   CMP Latest Ref Rng & Units 07/13/2019 06/08/2019 06/07/2018  Glucose 65 - 99 mg/dL 91 87 80  BUN 8 - 27 mg/dL '22 19 14  '$ Creatinine 0.57 - 1.00 mg/dL 0.91 0.69 0.64  Sodium 134 - 144 mmol/L 145(H) 145(H) 147(H)  Potassium 3.5 - 5.2 mmol/L 4.6 4.3 3.7  Chloride 96 - 106 mmol/L 108(H) 106 106  CO2 20 - 29 mmol/L '25 24 25  '$ Calcium 8.7 - 10.3 mg/dL 9.2 9.2 9.2  Total Protein 6.0 - 8.5 g/dL - 6.4 -  Total Bilirubin 0.0 - 1.2 mg/dL - 0.3 -  Alkaline Phos 39 - 117 IU/L - 74 -  AST 0 - 40 IU/L - 16 -  ALT 0 - 32 IU/L - 14 -      2. Vitamin D deficiency Pt is not taking oral repletion therapy, only taking multivitamin daily. Denies bone pain and tenderness, muscle weakness, fracture, and difficulty walking. Lab Results  Component Value Date   VD25OH 25.8 (L) 06/08/2019   VD25OH 21.1 (L) 09/07/2018   VD25OH 25.7 (L) 08/05/2016   Lab Results  Component Value Date   CALCIUM 9.2 07/13/2019      3. BMI 31.0-31.9,adult Pt does try to watch diet  and is active on a daily basis.   4. B12 deficiency Takes a multivitamin daily. Does not take B12 or B complex. Denies fatigue or paresthesias. No poor healing wounds or oral lesions.   5. Peripheral edema Well controlled with Lasix once daily. States swelling is very minimal. No chest pain, shortness of breath, dizziness, or palpitations. No syncope or changes in urine output. Does wear compression hose on a daily basis to help control the swelling.   6. Mixed hyperlipidemia Diet controlled. Does watch what she eats and tries to stay active on a regular basis.   Lab Results  Component Value Date   CHOL 165 06/08/2019   HDL 54 06/08/2019   LDLCALC 90 06/08/2019   TRIG 105 06/08/2019   CHOLHDL 3.1 06/08/2019     Family and personal medical history reviewed. Smoking and ETOH history reviewed.       Relevant past medical, surgical, family, and social history reviewed and updated as indicated.  Allergies and medications reviewed and updated. Date reviewed: Chart in Epic.   Past Medical History:  Diagnosis Date  . Hypertension     Past Surgical History:  Procedure Laterality Date  . ABDOMINAL HYSTERECTOMY    . BREAST EXCISIONAL BIOPSY Right   .  GALLBLADDER SURGERY    . HERNIA REPAIR    . TUMOR REMOVAL      Social History   Socioeconomic History  . Marital status: Married    Spouse name: Not on file  . Number of children: Not on file  . Years of education: Not on file  . Highest education level: Not on file  Occupational History  . Not on file  Tobacco Use  . Smoking status: Never Smoker  . Smokeless tobacco: Never Used  Substance and Sexual Activity  . Alcohol use: No  . Drug use: No  . Sexual activity: Not on file  Other Topics Concern  . Not on file  Social History Narrative  . Not on file   Social Determinants of Health   Financial Resource Strain:   . Difficulty of Paying Living Expenses: Not on file  Food Insecurity:   . Worried About Ship broker in the Last Year: Not on file  . Ran Out of Food in the Last Year: Not on file  Transportation Needs:   . Lack of Transportation (Medical): Not on file  . Lack of Transportation (Non-Medical): Not on file  Physical Activity:   . Days of Exercise per Week: Not on file  . Minutes of Exercise per Session: Not on file  Stress:   . Feeling of Stress : Not on file  Social Connections:   . Frequency of Communication with Friends and Family: Not on file  . Frequency of Social Gatherings with Friends and Family: Not on file  . Attends Religious Services: Not on file  . Active Member of Clubs or Organizations: Not on file  . Attends Archivist Meetings: Not on file  . Marital Status: Not on file  Intimate Partner Violence:   . Fear of Current or Ex-Partner: Not on file  . Emotionally Abused: Not on file  . Physically Abused: Not on file  . Sexually Abused: Not on file    Outpatient Encounter Medications as of 11/01/2019  Medication Sig  . furosemide (LASIX) 40 MG tablet Take 1 tablet (40 mg total) by mouth 2 (two) times daily.  Marland Kitchen lisinopril (ZESTRIL) 40 MG tablet Take 1 tablet (40 mg total) by mouth daily.  . Multiple Vitamins-Minerals (MULTIVITAMIN WITH MINERALS) tablet Take 1 tablet by mouth daily.  . [DISCONTINUED] furosemide (LASIX) 40 MG tablet Take 1 tablet (40 mg total) by mouth 2 (two) times daily.  . [DISCONTINUED] lisinopril (ZESTRIL) 40 MG tablet Take 1 tablet (40 mg total) by mouth daily.  . Vitamin D, Ergocalciferol, (DRISDOL) 1.25 MG (50000 UT) CAPS capsule Take 1 capsule (50,000 Units total) by mouth every 7 (seven) days.   No facility-administered encounter medications on file as of 11/01/2019.    No Known Allergies  Review of Systems  Constitutional: Negative for activity change, appetite change, chills, diaphoresis, fatigue, fever and unexpected weight change.  HENT: Negative.   Eyes: Negative.  Negative for photophobia and visual disturbance.    Respiratory: Negative for cough, chest tightness and shortness of breath.   Cardiovascular: Positive for palpitations. Negative for chest pain and leg swelling.  Gastrointestinal: Negative for abdominal pain, blood in stool, constipation, diarrhea, nausea and vomiting.  Endocrine: Negative.  Negative for heat intolerance, polydipsia, polyphagia and polyuria.  Genitourinary: Negative for decreased urine volume, difficulty urinating, dysuria, frequency and urgency.  Musculoskeletal: Negative for arthralgias and myalgias.  Skin: Negative.   Allergic/Immunologic: Negative.   Neurological: Negative for dizziness, seizures, syncope, facial  asymmetry, speech difficulty, weakness, light-headedness, numbness and headaches.  Hematological: Negative.   Psychiatric/Behavioral: Negative for confusion, hallucinations, sleep disturbance and suicidal ideas.  All other systems reviewed and are negative.       Objective:  BP 110/65   Pulse 61   Temp 97.8 F (36.6 C)   Resp 20   Ht '5\' 4"'$  (1.626 m)   Wt 159 lb (72.1 kg)   SpO2 98%   BMI 27.29 kg/m    Wt Readings from Last 3 Encounters:  11/01/19 159 lb (72.1 kg)  07/13/19 169 lb (76.7 kg)  06/08/19 178 lb (80.7 kg)    Physical Exam Vitals and nursing note reviewed.  Constitutional:      General: She is not in acute distress.    Appearance: Normal appearance. She is well-developed, well-groomed and overweight. She is not ill-appearing, toxic-appearing or diaphoretic.  HENT:     Head: Normocephalic and atraumatic.     Jaw: There is normal jaw occlusion.     Right Ear: Hearing normal.     Left Ear: Hearing normal.     Nose: Nose normal.     Mouth/Throat:     Lips: Pink.     Mouth: Mucous membranes are moist.     Pharynx: Oropharynx is clear. Uvula midline.  Eyes:     General: Lids are normal.     Extraocular Movements: Extraocular movements intact.     Conjunctiva/sclera: Conjunctivae normal.     Pupils: Pupils are equal, round, and  reactive to light.  Neck:     Thyroid: No thyroid mass, thyromegaly or thyroid tenderness.     Vascular: No carotid bruit or JVD.     Trachea: Trachea and phonation normal.  Cardiovascular:     Rate and Rhythm: Normal rate and regular rhythm.     Chest Wall: PMI is not displaced.     Pulses: Normal pulses.     Heart sounds: Normal heart sounds. No murmur. No friction rub. No gallop.   Pulmonary:     Effort: Pulmonary effort is normal. No respiratory distress.     Breath sounds: Normal breath sounds. No wheezing.  Abdominal:     General: Bowel sounds are normal. There is no distension or abdominal bruit.     Palpations: Abdomen is soft. There is no hepatomegaly or splenomegaly.     Tenderness: There is no abdominal tenderness. There is no right CVA tenderness or left CVA tenderness.     Hernia: No hernia is present.  Musculoskeletal:        General: Normal range of motion.     Cervical back: Normal range of motion and neck supple.     Right lower leg: No edema.     Left lower leg: No edema.  Lymphadenopathy:     Cervical: No cervical adenopathy.  Skin:    General: Skin is warm and dry.     Capillary Refill: Capillary refill takes less than 2 seconds.     Coloration: Skin is not cyanotic, jaundiced or pale.     Findings: No rash.  Neurological:     General: No focal deficit present.     Mental Status: She is alert and oriented to person, place, and time.     Cranial Nerves: Cranial nerves are intact. No cranial nerve deficit.     Sensory: Sensation is intact. No sensory deficit.     Motor: Motor function is intact. No weakness.     Coordination: Coordination is intact. Coordination normal.  Gait: Gait is intact. Gait normal.     Deep Tendon Reflexes: Reflexes are normal and symmetric. Reflexes normal.  Psychiatric:        Attention and Perception: Attention and perception normal.        Mood and Affect: Mood and affect normal.        Speech: Speech normal.        Behavior:  Behavior normal. Behavior is cooperative.        Thought Content: Thought content normal.        Cognition and Memory: Cognition and memory normal.        Judgment: Judgment normal.     Results for orders placed or performed in visit on 07/13/19  [facility]  Result Value Ref Range   Glucose 91 65 - 99 mg/dL   BUN 22 8 - 27 mg/dL   Creatinine, Ser 0.91 0.57 - 1.00 mg/dL   GFR calc non Af Amer 66 >59 mL/min/1.73   GFR calc Af Amer 77 >59 mL/min/1.73   BUN/Creatinine Ratio 24 12 - 28   Sodium 145 (H) 134 - 144 mmol/L   Potassium 4.6 3.5 - 5.2 mmol/L   Chloride 108 (H) 96 - 106 mmol/L   CO2 25 20 - 29 mmol/L   Calcium 9.2 8.7 - 10.3 mg/dL     EKG: SB without acute changes. No ectopy or ST elevation. Incomplete BBB. No previous EKG available for comparison. Monia Pouch, FNP-C  Pertinent labs & imaging results that were available during my care of the patient were reviewed by me and considered in my medical decision making.  Assessment & Plan:  Royalty was seen today for medical management of chronic issues and hypertension.  Diagnoses and all orders for this visit:  Essential hypertension BP well controlled. Changes were not made in regimen today. Goal BP is 130/80. Pt aware to report any persistent high or low readings. DASH diet and exercise encouraged. Exercise at least 150 minutes per week and increase as tolerated. Goal BMI > 25. Stress management encouraged. Avoid nicotine and tobacco product use. Avoid excessive alcohol and NSAID's. Avoid more than 2000 mg of sodium daily. Medications as prescribed. Follow up as scheduled.  -     CMP14+EGFR -     Lipid panel -     Thyroid Panel With TSH -     EKG 12-Lead -     lisinopril (ZESTRIL) 40 MG tablet; Take 1 tablet (40 mg total) by mouth daily.  Vitamin D deficiency Labs pending. Initiate repletion therapy. If indicated, will change repletion dosage. Eat foods rich in Vit D including milk, orange juice, yogurt with vitamin D added,  salmon or mackerel, canned tuna fish, cereals with vitamin D added, and cod liver oil. Get out in the sun but make sure to wear at least SPF 30 sunscreen.  -     Vitamin D 25 hydroxy -     Vitamin D, Ergocalciferol, (DRISDOL) 1.25 MG (50000 UT) CAPS capsule; Take 1 capsule (50,000 Units total) by mouth every 7 (seven) days.  BMI 31.0-31.9,adult Diet and exercise encouraged. Labs pending.  -     CMP14+EGFR -     Lipid panel -     Thyroid Panel With TSH -     CBC with Differential/Platelet  B12 deficiency Labs pending, will initiate repletion therapy if warranted.  -     Vitamin B12  Peripheral edema Improvement in symptoms. Continue lasix as prescribed. Limit sodium intake and elevate legs when  sitting. Compression hose if beneficial.  -     CMP14+EGFR -     EKG 12-Lead -     furosemide (LASIX) 40 MG tablet; Take 1 tablet (40 mg total) by mouth 2 (two) times daily.  Mixed hyperlipidemia Diet and exercise encouraged. Will initiate therapy if needed. Labs pending.  -     Lipid panel  Need for immunization against influenza -     Flu Vaccine QUAD High Dose(Fluad)     Continue all other maintenance medications.  Follow up plan: Return in about 4 months (around 03/01/2020), or if symptoms worsen or fail to improve, for HTN.  Continue healthy lifestyle choices, including diet (rich in fruits, vegetables, and lean proteins, and low in salt and simple carbohydrates) and exercise (at least 30 minutes of moderate physical activity daily).  Educational handout given for DASH diet  The above assessment and management plan was discussed with the patient. The patient verbalized understanding of and has agreed to the management plan. Patient is aware to call the clinic if they develop any new symptoms or if symptoms persist or worsen. Patient is aware when to return to the clinic for a follow-up visit. Patient educated on when it is appropriate to go to the emergency department.   Monia Pouch, FNP-C [facility] [phone]

## 2019-11-01 NOTE — Patient Instructions (Signed)
DASH Eating Plan DASH stands for "Dietary Approaches to Stop Hypertension." The DASH eating plan is a healthy eating plan that has been shown to reduce high blood pressure (hypertension). Additional health benefits may include reducing the risk of type 2 diabetes mellitus, heart disease, and stroke. The DASH eating plan may also help with weight loss.  WHAT DO I NEED TO KNOW ABOUT THE DASH EATING PLAN? For the DASH eating plan, you will follow these general guidelines:  Choose foods with a percent daily value for sodium of less than 5% (as listed on the food label).  Use salt-free seasonings or herbs instead of table salt or sea salt.  Check with your health care provider or pharmacist before using salt substitutes.  Eat lower-sodium products, often labeled as "lower sodium" or "no salt added."  Eat fresh foods.  Eat more vegetables, fruits, and low-fat dairy products.  Choose whole grains. Look for the word "whole" as the first word in the ingredient list.  Choose fish and skinless chicken or turkey more often than red meat. Limit fish, poultry, and meat to 6 oz (170 g) each day.  Limit sweets, desserts, sugars, and sugary drinks.  Choose heart-healthy fats.  Limit cheese to 1 oz (28 g) per day.  Eat more home-cooked food and less restaurant, buffet, and fast food.  Limit fried foods.  Cook foods using methods other than frying.  Limit canned vegetables. If you do use them, rinse them well to decrease the sodium.  When eating at a restaurant, ask that your food be prepared with less salt, or no salt if possible.  WHAT FOODS CAN I EAT? Seek help from a dietitian for individual calorie needs.  Grains Whole grain or whole wheat bread. Brown rice. Whole grain or whole wheat pasta. Quinoa, bulgur, and whole grain cereals. Low-sodium cereals. Corn or whole wheat flour tortillas. Whole grain cornbread. Whole grain crackers. Low-sodium crackers.  Vegetables Fresh or frozen  vegetables (raw, steamed, roasted, or grilled). Low-sodium or reduced-sodium tomato and vegetable juices. Low-sodium or reduced-sodium tomato sauce and paste. Low-sodium or reduced-sodium canned vegetables.   Fruits All fresh, canned (in natural juice), or frozen fruits.  Meat and Other Protein Products Ground beef (85% or leaner), grass-fed beef, or beef trimmed of fat. Skinless chicken or turkey. Ground chicken or turkey. Pork trimmed of fat. All fish and seafood. Eggs. Dried beans, peas, or lentils. Unsalted nuts and seeds. Unsalted canned beans.  Dairy Low-fat dairy products, such as skim or 1% milk, 2% or reduced-fat cheeses, low-fat ricotta or cottage cheese, or plain low-fat yogurt. Low-sodium or reduced-sodium cheeses.  Fats and Oils Tub margarines without trans fats. Light or reduced-fat mayonnaise and salad dressings (reduced sodium). Avocado. Safflower, olive, or canola oils. Natural peanut or almond butter.  Other Unsalted popcorn and pretzels. The items listed above may not be a complete list of recommended foods or beverages. Contact your dietitian for more options.  WHAT FOODS ARE NOT RECOMMENDED?  Grains White bread. White pasta. White rice. Refined cornbread. Bagels and croissants. Crackers that contain trans fat.  Vegetables Creamed or fried vegetables. Vegetables in a cheese sauce. Regular canned vegetables. Regular canned tomato sauce and paste. Regular tomato and vegetable juices.  Fruits Dried fruits. Canned fruit in light or heavy syrup. Fruit juice.  Meat and Other Protein Products Fatty cuts of meat. Ribs, chicken wings, bacon, sausage, bologna, salami, chitterlings, fatback, hot dogs, bratwurst, and packaged luncheon meats. Salted nuts and seeds. Canned beans with salt.    Dairy Whole or 2% milk, cream, half-and-half, and cream cheese. Whole-fat or sweetened yogurt. Full-fat cheeses or blue cheese. Nondairy creamers and whipped toppings. Processed cheese,  cheese spreads, or cheese curds.  Condiments Onion and garlic salt, seasoned salt, table salt, and sea salt. Canned and packaged gravies. Worcestershire sauce. Tartar sauce. Barbecue sauce. Teriyaki sauce. Soy sauce, including reduced sodium. Steak sauce. Fish sauce. Oyster sauce. Cocktail sauce. Horseradish. Ketchup and mustard. Meat flavorings and tenderizers. Bouillon cubes. Hot sauce. Tabasco sauce. Marinades. Taco seasonings. Relishes.  Fats and Oils Butter, stick margarine, lard, shortening, ghee, and bacon fat. Coconut, palm kernel, or palm oils. Regular salad dressings.  Other Pickles and olives. Salted popcorn and pretzels.  The items listed above may not be a complete list of foods and beverages to avoid. Contact your dietitian for more information.  WHERE CAN I FIND MORE INFORMATION? National Heart, Lung, and [employer] Institute: www.nhlbi.nih.gov/health/health-topics/topics/dash/ Document Released: 10/15/2011 Document Revised: 03/12/2014 Document Reviewed: 08/30/2013 Exit[employer] Patient Information 2015 [employer]. This information is not intended to replace advice given to you by your health care provider. Make sure you discuss any questions you have with your health care provider.   I think that you would greatly benefit from seeing a nutritionist.  If you are interested, please call Dr Sykes at [phone] to schedule an appointment.   

## 2019-11-02 LAB — CMP14+EGFR
ALT: 16 IU/L (ref 0–32)
AST: 17 IU/L (ref 0–40)
Albumin/Globulin Ratio: 2.3 — ABNORMAL HIGH (ref 1.2–2.2)
Albumin: 4.2 g/dL (ref 3.8–4.8)
Alkaline Phosphatase: 72 IU/L (ref 39–117)
BUN/Creatinine Ratio: 28 (ref 12–28)
BUN: 18 mg/dL (ref 8–27)
Bilirubin Total: 0.6 mg/dL (ref 0.0–1.2)
CO2: 23 mmol/L (ref 20–29)
Calcium: 8.8 mg/dL (ref 8.7–10.3)
Chloride: 102 mmol/L (ref 96–106)
Creatinine, Ser: 0.65 mg/dL (ref 0.57–1.00)
GFR calc Af Amer: 107 mL/min/{1.73_m2} (ref >59)
GFR calc non Af Amer: 93 mL/min/{1.73_m2} (ref >59)
Globulin, Total: 1.8 g/dL (ref 1.5–4.5)
Glucose: 74 mg/dL (ref 65–99)
Potassium: 3.5 mmol/L (ref 3.5–5.2)
Sodium: 145 mmol/L — ABNORMAL HIGH (ref 134–144)
Total Protein: 6 g/dL (ref 6.0–8.5)

## 2019-11-02 LAB — LIPID PANEL
Chol/HDL Ratio: 2.7 ratio (ref 0.0–4.4)
Cholesterol, Total: 163 mg/dL (ref 100–199)
HDL: 61 mg/dL (ref >39)
LDL Chol Calc (NIH): 93 mg/dL (ref 0–99)
Triglycerides: 41 mg/dL (ref 0–149)
VLDL Cholesterol Cal: 9 mg/dL (ref 5–40)

## 2019-11-02 LAB — THYROID PANEL WITH TSH
Free Thyroxine Index: 1.5 (ref 1.2–4.9)
T3 Uptake Ratio: 25 % (ref 24–39)
T4, Total: 6.1 ug/dL (ref 4.5–12.0)
TSH: 0.898 u[IU]/mL (ref 0.450–4.500)

## 2019-11-02 LAB — CBC WITH DIFFERENTIAL/PLATELET
Basophils Absolute: 0.1 10*3/uL (ref 0.0–0.2)
Basos: 1 %
EOS (ABSOLUTE): 0.3 10*3/uL (ref 0.0–0.4)
Eos: 5 %
Hematocrit: 38.3 % (ref 34.0–46.6)
Hemoglobin: 12.6 g/dL (ref 11.1–15.9)
Immature Grans (Abs): 0 10*3/uL (ref 0.0–0.1)
Immature Granulocytes: 0 %
Lymphocytes Absolute: 1.5 10*3/uL (ref 0.7–3.1)
Lymphs: 29 %
MCH: 31.3 pg (ref 26.6–33.0)
MCHC: 32.9 g/dL (ref 31.5–35.7)
MCV: 95 fL (ref 79–97)
Monocytes Absolute: 0.4 10*3/uL (ref 0.1–0.9)
Monocytes: 8 %
Neutrophils Absolute: 3.1 10*3/uL (ref 1.4–7.0)
Neutrophils: 57 %
Platelets: 251 10*3/uL (ref 150–450)
RBC: 4.02 x10E6/uL (ref 3.77–5.28)
RDW: 12 % (ref 11.7–15.4)
WBC: 5.4 10*3/uL (ref 3.4–10.8)

## 2019-11-02 LAB — VITAMIN D 25 HYDROXY (VIT D DEFICIENCY, FRACTURES): Vit D, 25-Hydroxy: 24.4 ng/mL — ABNORMAL LOW (ref 30.0–100.0)

## 2019-11-02 LAB — VITAMIN B12: Vitamin B-12: 394 pg/mL (ref 232–1245)

## 2019-11-16 ENCOUNTER — Other Ambulatory Visit: Payer: Self-pay | Admitting: *Deleted

## 2019-11-16 DIAGNOSIS — I10 Essential (primary) hypertension: Secondary | ICD-10-CM

## 2019-11-16 DIAGNOSIS — R609 Edema, unspecified: Secondary | ICD-10-CM

## 2019-11-16 MED ORDER — FUROSEMIDE 40 MG PO TABS: 40.0000 mg | ORAL_TABLET | Freq: Two times a day (BID) | ORAL | 1 refills | Status: DC

## 2019-11-16 MED ORDER — LISINOPRIL 40 MG PO TABS: 40.0000 mg | ORAL_TABLET | Freq: Every day | ORAL | 1 refills | Status: DC

## 2020-02-08 ENCOUNTER — Other Ambulatory Visit: Payer: Self-pay | Admitting: Family Medicine

## 2020-02-08 DIAGNOSIS — Z1231 Encounter for screening mammogram for malignant neoplasm of breast: Secondary | ICD-10-CM

## 2020-02-14 ENCOUNTER — Encounter: Payer: Self-pay | Admitting: Family Medicine

## 2020-02-14 ENCOUNTER — Other Ambulatory Visit: Payer: Medicare Other

## 2020-02-14 ENCOUNTER — Ambulatory Visit (INDEPENDENT_AMBULATORY_CARE_PROVIDER_SITE_OTHER): Payer: Medicare HMO | Admitting: Family Medicine

## 2020-02-14 ENCOUNTER — Other Ambulatory Visit: Payer: Self-pay

## 2020-02-14 VITALS — BP 107/69 | HR 66 | Temp 99.3°F | Ht 64.0 in | Wt 165.6 lb

## 2020-02-14 DIAGNOSIS — I10 Essential (primary) hypertension: Secondary | ICD-10-CM | POA: Diagnosis not present

## 2020-02-14 DIAGNOSIS — E559 Vitamin D deficiency, unspecified: Secondary | ICD-10-CM

## 2020-02-14 DIAGNOSIS — R350 Frequency of micturition: Secondary | ICD-10-CM | POA: Diagnosis not present

## 2020-02-14 DIAGNOSIS — N3281 Overactive bladder: Secondary | ICD-10-CM | POA: Diagnosis not present

## 2020-02-14 LAB — URINALYSIS
Bilirubin, UA: NEGATIVE
Glucose, UA: NEGATIVE
Ketones, UA: NEGATIVE
Leukocytes,UA: NEGATIVE
Nitrite, UA: NEGATIVE
Protein,UA: NEGATIVE
RBC, UA: NEGATIVE
Specific Gravity, UA: 1.015 (ref 1.005–1.030)
Urobilinogen, Ur: 0.2 mg/dL (ref 0.2–1.0)
pH, UA: 7 (ref 5.0–7.5)

## 2020-02-14 MED ORDER — MIRABEGRON ER 25 MG PO TB24: 25.0000 mg | ORAL_TABLET | Freq: Every day | ORAL | 6 refills | Status: DC

## 2020-02-14 NOTE — Patient Instructions (Signed)

## 2020-02-14 NOTE — Progress Notes (Addendum)
Subjective:  Patient ID: Krista Gonzales, female    DOB: [birth year], 69 y.o.   MRN: [id number]  Patient Care Team: [name], FNP as PCP - General (Family Medicine)   Chief Complaint:  Medical Management of Chronic Issues, Hypertension, and Urinary Frequency   HPI: Krista Gonzales is a 69 y.o. female presenting on 02/14/2020 for Medical Management of Chronic Issues, Hypertension, and Urinary Frequency   1. Vitamin D deficiency On supplementation. No weakness or myalgias. Will obtain vitamin D level today.  2. Essential hypertension Blood pressure well controlled on Zestril and Lasix. Denies palpitations, syncope, headaches, or edema. No chest pain, weakness, or confusion.   3. Urine frequency Patient states this has been going on for 2 months. Will obtain urine culture. No dysuria or incontinence. States she has to stop several times when traveling to void. She does have to get up at least 2 times per night to void.      Relevant past medical, surgical, family, and social history reviewed and updated as indicated.  Allergies and medications reviewed and updated. Date reviewed: Chart in Epic.   Past Medical History:  Diagnosis Date  . Hypertension     Past Surgical History:  Procedure Laterality Date  . ABDOMINAL HYSTERECTOMY    . BREAST EXCISIONAL BIOPSY Right   . GALLBLADDER SURGERY    . HERNIA REPAIR    . TUMOR REMOVAL      Social History   Socioeconomic History  . Marital status: Married    Spouse name: Not on file  . Number of children: Not on file  . Years of education: Not on file  . Highest education level: Not on file  Occupational History  . Not on file  Tobacco Use  . Smoking status: Never Smoker  . Smokeless tobacco: Never Used  Substance and Sexual Activity  . Alcohol use: No  . Drug use: No  . Sexual activity: Not on file  Other Topics Concern  . Not on file  Social History Narrative  . Not on file   Social Determinants of  Health   Financial Resource Strain:   . Difficulty of Paying Living Expenses:   Food Insecurity:   . Worried About Charity fundraiser in the Last Year:   . Arboriculturist in the Last Year:   Transportation Needs:   . Film/video editor (Medical):   Marland Kitchen Lack of Transportation (Non-Medical):   Physical Activity:   . Days of Exercise per Week:   . Minutes of Exercise per Session:   Stress:   . Feeling of Stress :   Social Connections:   . Frequency of Communication with Friends and Family:   . Frequency of Social Gatherings with Friends and Family:   . Attends Religious Services:   . Active Member of Clubs or Organizations:   . Attends Archivist Meetings:   Marland Kitchen Marital Status:   Intimate Partner Violence:   . Fear of Current or Ex-Partner:   . Emotionally Abused:   Marland Kitchen Physically Abused:   . Sexually Abused:     Outpatient Encounter Medications as of 02/14/2020  Medication Sig  . furosemide (LASIX) 40 MG tablet Take 1 tablet (40 mg total) by mouth 2 (two) times daily.  Marland Kitchen lisinopril (ZESTRIL) 40 MG tablet Take 1 tablet (40 mg total) by mouth daily.  . Multiple Vitamins-Minerals (MULTIVITAMIN WITH MINERALS) tablet Take 1 tablet by mouth daily.  . Vitamin D, Ergocalciferol, (  DRISDOL) 1.25 MG (50000 UT) CAPS capsule Take 1 capsule (50,000 Units total) by mouth every 7 (seven) days.   No facility-administered encounter medications on file as of 02/14/2020.    No Known Allergies  Review of Systems  Constitutional: Negative for activity change, appetite change, chills, diaphoresis, fatigue, fever and unexpected weight change.  HENT: Negative.   Eyes: Negative.  Negative for photophobia and visual disturbance.  Respiratory: Negative for cough, chest tightness and shortness of breath.   Cardiovascular: Negative for chest pain, palpitations and leg swelling.  Gastrointestinal: Negative for abdominal pain, blood in stool, constipation, diarrhea, nausea and vomiting.    Endocrine: Negative.  Negative for polydipsia, polyphagia and polyuria.  Genitourinary: Positive for frequency and urgency. Negative for decreased urine volume, difficulty urinating and dysuria.  Musculoskeletal: Positive for arthralgias. Negative for myalgias.       Left lateral knee  Skin: Negative.   Allergic/Immunologic: Negative.   Neurological: Negative for dizziness, tremors, seizures, syncope, facial asymmetry, speech difficulty, weakness, light-headedness, numbness and headaches.  Hematological: Negative.  Negative for adenopathy. Does not bruise/bleed easily.  Psychiatric/Behavioral: Negative for confusion, hallucinations, sleep disturbance and suicidal ideas.  All other systems reviewed and are negative.       Objective:  BP 107/69   Pulse 66   Temp 99.3 F (37.4 C)   Ht '5\' 4"'$  (1.626 m)   Wt 165 lb 9.6 oz (75.1 kg)   SpO2 98%   BMI 28.43 kg/m    Wt Readings from Last 3 Encounters:  02/14/20 165 lb 9.6 oz (75.1 kg)  11/01/19 159 lb (72.1 kg)  07/13/19 169 lb (76.7 kg)    Physical Exam Vitals and nursing note reviewed.  Constitutional:      General: She is not in acute distress.    Appearance: Normal appearance. She is well-developed, well-groomed and overweight. She is not ill-appearing, toxic-appearing or diaphoretic.  HENT:     Head: Normocephalic and atraumatic.     Jaw: There is normal jaw occlusion.     Right Ear: Hearing normal.     Left Ear: Hearing normal.     Nose: Nose normal.     Mouth/Throat:     Lips: Pink.     Mouth: Mucous membranes are moist.     Pharynx: Oropharynx is clear. Uvula midline.  Eyes:     General: Lids are normal.     Extraocular Movements: Extraocular movements intact.     Conjunctiva/sclera: Conjunctivae normal.     Pupils: Pupils are equal, round, and reactive to light.  Neck:     Thyroid: No thyroid mass, thyromegaly or thyroid tenderness.     Vascular: No carotid bruit or JVD.     Trachea: Trachea and phonation  normal.  Cardiovascular:     Rate and Rhythm: Normal rate and regular rhythm.     Chest Wall: PMI is not displaced.     Pulses: Normal pulses.     Heart sounds: Normal heart sounds. No murmur. No friction rub. No gallop.      Comments: Varicose veins on bilateral legs, left leg greater than right leg. Pulmonary:     Effort: Pulmonary effort is normal. No respiratory distress.     Breath sounds: Normal breath sounds. No wheezing.  Abdominal:     General: Bowel sounds are normal. There is no distension or abdominal bruit.     Palpations: Abdomen is soft. There is no hepatomegaly or splenomegaly.     Tenderness: There is no abdominal tenderness.  There is no right CVA tenderness or left CVA tenderness.     Hernia: No hernia is present.  Musculoskeletal:        General: Normal range of motion.     Cervical back: Normal range of motion and neck supple.     Right lower leg: No edema.     Left lower leg: No edema.  Lymphadenopathy:     Cervical: No cervical adenopathy.  Skin:    General: Skin is warm and dry.     Capillary Refill: Capillary refill takes less than 2 seconds.     Coloration: Skin is not cyanotic, jaundiced or pale.     Findings: No rash.  Neurological:     General: No focal deficit present.     Mental Status: She is alert and oriented to person, place, and time.     Cranial Nerves: Cranial nerves are intact. No cranial nerve deficit.     Sensory: Sensation is intact. No sensory deficit.     Motor: Motor function is intact. No weakness.     Coordination: Coordination is intact. Coordination normal.     Gait: Gait is intact. Gait normal.     Deep Tendon Reflexes: Reflexes are normal and symmetric. Reflexes normal.  Psychiatric:        Attention and Perception: Attention and perception normal.        Mood and Affect: Mood and affect normal.        Speech: Speech normal.        Behavior: Behavior normal. Behavior is cooperative.        Thought Content: Thought content  normal.        Cognition and Memory: Cognition and memory normal.        Judgment: Judgment normal.     Results for orders placed or performed in visit on 11/01/19  CMP14+EGFR  Result Value Ref Range   Glucose 74 65 - 99 mg/dL   BUN 18 8 - 27 mg/dL   Creatinine, Ser 0.65 0.57 - 1.00 mg/dL   GFR calc non Af Amer 93 >59 mL/min/1.73   GFR calc Af Amer 107 >59 mL/min/1.73   BUN/Creatinine Ratio 28 12 - 28   Sodium 145 (H) 134 - 144 mmol/L   Potassium 3.5 3.5 - 5.2 mmol/L   Chloride 102 96 - 106 mmol/L   CO2 23 20 - 29 mmol/L   Calcium 8.8 8.7 - 10.3 mg/dL   Total Protein 6.0 6.0 - 8.5 g/dL   Albumin 4.2 3.8 - 4.8 g/dL   Globulin, Total 1.8 1.5 - 4.5 g/dL   Albumin/Globulin Ratio 2.3 (H) 1.2 - 2.2   Bilirubin Total 0.6 0.0 - 1.2 mg/dL   Alkaline Phosphatase 72 39 - 117 IU/L   AST 17 0 - 40 IU/L   ALT 16 0 - 32 IU/L  Lipid panel  Result Value Ref Range   Cholesterol, Total 163 100 - 199 mg/dL   Triglycerides 41 0 - 149 mg/dL   HDL 61 >39 mg/dL   VLDL Cholesterol Cal 9 5 - 40 mg/dL   LDL Chol Calc (NIH) 93 0 - 99 mg/dL   Chol/HDL Ratio 2.7 0.0 - 4.4 ratio  Vitamin D 25 hydroxy  Result Value Ref Range   Vit D, 25-Hydroxy 24.4 (L) 30.0 - 100.0 ng/mL  Vitamin B12  Result Value Ref Range   Vitamin B-12 394 232 - 1,245 pg/mL  Thyroid Panel With TSH  Result Value Ref Range   TSH 0.898  0.450 - 4.500 uIU/mL   T4, Total 6.1 4.5 - 12.0 ug/dL   T3 Uptake Ratio 25 24 - 39 %   Free Thyroxine Index 1.5 1.2 - 4.9  CBC with Differential/Platelet  Result Value Ref Range   WBC 5.4 3.4 - 10.8 x10E3/uL   RBC 4.02 3.77 - 5.28 x10E6/uL   Hemoglobin 12.6 11.1 - 15.9 g/dL   Hematocrit 38.3 34.0 - 46.6 %   MCV 95 79 - 97 fL   MCH 31.3 26.6 - 33.0 pg   MCHC 32.9 31.5 - 35.7 g/dL   RDW 12.0 11.7 - 15.4 %   Platelets 251 150 - 450 x10E3/uL   Neutrophils 57 Not Estab. %   Lymphs 29 Not Estab. %   Monocytes 8 Not Estab. %   Eos 5 Not Estab. %   Basos 1 Not Estab. %   Neutrophils Absolute  3.1 1.4 - 7.0 x10E3/uL   Lymphocytes Absolute 1.5 0.7 - 3.1 x10E3/uL   Monocytes Absolute 0.4 0.1 - 0.9 x10E3/uL   EOS (ABSOLUTE) 0.3 0.0 - 0.4 x10E3/uL   Basophils Absolute 0.1 0.0 - 0.2 x10E3/uL   Immature Granulocytes 0 Not Estab. %   Immature Grans (Abs) 0.0 0.0 - 0.1 x10E3/uL       Pertinent labs & imaging results that were available during my care of the patient were reviewed by me and considered in my medical decision making.  Assessment & Plan:  Krista Gonzales was seen today for medical management of chronic issues, hypertension and urinary frequency.  Diagnoses and all orders for this visit:  Vitamin D deficiency Labs pending. Continue repletion therapy. If indicated, will change repletion dosage. Eat foods rich in Vit D including milk, orange juice, yogurt with vitamin D added, salmon or mackerel, canned tuna fish, cereals with vitamin D added, and cod liver oil. Get out in the sun but make sure to wear at least SPF 30 sunscreen.   Essential hypertension BP well controlled. Changes today made in regimen were not. Goal BP is 130/80. Pt aware to report any persistent high or low readings. DASH diet and exercise encouraged. Exercise at least 150 minutes per week and increase as tolerated. Goal BMI > 25. Stress management encouraged. Avoid nicotine and tobacco product use. Avoid excessive alcohol and NSAID's. Avoid more than 2000 mg of sodium daily. Medications as prescribed. Follow up as scheduled.   Urine frequency OAB Urinalysis unremarkable in office.  Will trial Myrbetric 25 mg daily. Samples provided in office. Pt to follow up in 4-6 weeks for reevaluation.  -     Urinalysis -     Urine Culture negative. Patient given Myrbetriq samples.     Continue all other maintenance medications.  Follow up plan: Return in about 6 months (around 08/15/2020), or if symptoms worsen or fail to improve.   Medical decision-making:  15 minutes spent today reviewing the medical chart, counseling  the patient/family, and documenting today's visit.  Continue healthy lifestyle choices, including diet (rich in fruits, vegetables, and lean proteins, and low in salt and simple carbohydrates) and exercise (at least 30 minutes of moderate physical activity daily).  Educational handout given for [employer]. Samples given to patient.  The above assessment and management plan was discussed with the patient. The patient verbalized understanding of and has agreed to the management plan. Patient is aware to call the clinic if they develop any new symptoms or if symptoms persist or worsen. Patient is aware when to return to the clinic for a  follow-up visit. Patient educated on when it is appropriate to go to the emergency department.   Krista Pane, RN, FNP student  I personally was present during the history, physical exam, and medical decision-making activities of this service and have verified that the service and findings are accurately documented in the nurse practitioner student's note.  Monia Pouch, FNP-C [facility] [address] [place], [place] [address] [phone]

## 2020-02-15 LAB — CMP14+EGFR
ALT: 16 IU/L (ref 0–32)
AST: 17 IU/L (ref 0–40)
Albumin/Globulin Ratio: 2.1 (ref 1.2–2.2)
Albumin: 4.6 g/dL (ref 3.8–4.8)
Alkaline Phosphatase: 86 IU/L (ref 39–117)
BUN/Creatinine Ratio: 16 (ref 12–28)
BUN: 13 mg/dL (ref 8–27)
Bilirubin Total: 0.6 mg/dL (ref 0.0–1.2)
CO2: 26 mmol/L (ref 20–29)
Calcium: 9.8 mg/dL (ref 8.7–10.3)
Chloride: 105 mmol/L (ref 96–106)
Creatinine, Ser: 0.8 mg/dL (ref 0.57–1.00)
GFR calc Af Amer: 89 mL/min/{1.73_m2} (ref >59)
GFR calc non Af Amer: 77 mL/min/{1.73_m2} (ref >59)
Globulin, Total: 2.2 g/dL (ref 1.5–4.5)
Glucose: 80 mg/dL (ref 65–99)
Potassium: 3.8 mmol/L (ref 3.5–5.2)
Sodium: 147 mmol/L — ABNORMAL HIGH (ref 134–144)
Total Protein: 6.8 g/dL (ref 6.0–8.5)

## 2020-02-15 LAB — URINE CULTURE

## 2020-02-15 LAB — VITAMIN D 25 HYDROXY (VIT D DEFICIENCY, FRACTURES): Vit D, 25-Hydroxy: 40.7 ng/mL (ref 30.0–100.0)

## 2020-02-16 ENCOUNTER — Other Ambulatory Visit: Payer: Self-pay | Admitting: Family Medicine

## 2020-02-16 DIAGNOSIS — I10 Essential (primary) hypertension: Secondary | ICD-10-CM

## 2020-02-20 ENCOUNTER — Ambulatory Visit (INDEPENDENT_AMBULATORY_CARE_PROVIDER_SITE_OTHER): Payer: Medicare HMO | Admitting: *Deleted

## 2020-02-20 DIAGNOSIS — Z0000 Encounter for general adult medical examination without abnormal findings: Secondary | ICD-10-CM

## 2020-02-20 NOTE — Patient Instructions (Addendum)
[place] Maintenance Summary and Written Plan of Care  [name] ,  Thank you for allowing me to perform your Medicare Annual Wellness Visit and for your ongoing commitment to your health.   Health Maintenance & Immunization History Health Maintenance  Topic Date Due  . DEXA SCAN  Never done  . PNA vac Low Risk Adult (2 of 2 - PPSV23) 06/07/2020  . INFLUENZA VACCINE  06/09/2020  . MAMMOGRAM  01/02/2021  . TETANUS/TDAP  08/23/2023  . COLONOSCOPY  08/17/2026  . Hepatitis C Screening  Completed  . COLON CANCER SCREENING ANNUAL FOBT  Discontinued   Immunization History  Administered Date(s) Administered  . Fluad Quad(high Dose 65+) 11/01/2019  . Influenza,inj,Quad PF,6+ Mos 11/19/2015, 09/07/2018  . Pneumococcal Conjugate-13 06/08/2019  . Tdap 08/22/2013    These are the patient goals that we discussed: Goals Addressed            This Visit's Progress   . Have 3 meals a day       02/20/2020 AWV Goal: Improved Nutrition/Diet  . Patient will verbalize understanding that diet plays an important role in overall health and that a poor diet is a risk factor for many chronic medical conditions.  . Over the next year, patient will improve self management of their diet by incorporating improved meal pattern, less frequent dining out, more consistent meal timing, increased physical activity, improved protein intake, better food choices, and eat 6 small meals per day . . . Patient will utilize available community resources to help with food acquisition if needed (ex: food pantries, [address], etc) . Patient will work with nutrition specialist if a referral was made         This is a list of Health Maintenance Items that are overdue or due now: Health Maintenance Due  Topic Date Due  . DEXA SCAN  Never done     Orders/Referrals Placed Today: No orders of the defined types were placed in this encounter.  (Contact our referral department at  [phone] if you have not spoken with someone about your referral appointment within the next 5 days)    Follow-up Plan Schedule follow up to establish care with new PCP     Advance Directive  Advance directives are legal documents that let you make choices ahead of time about your health care and medical treatment in case you become unable to communicate for yourself. Advance directives are a way for you to make known your wishes to family, friends, and health care providers. This can let others know about your end-of-life care if you become unable to communicate. Discussing and writing advance directives should happen over time rather than all at once. Advance directives can be changed depending on your situation and what you want, even after you have signed the advance directives. There are different types of advance directives, such as:  Medical power of attorney.  Living will.  Do not resuscitate (DNR) or do not attempt resuscitation (DNAR) order. Health care proxy and medical power of attorney A health care proxy is also called a health care agent. This is a person who is appointed to make medical decisions for you in cases where you are unable to make the decisions yourself. Generally, people choose someone they know well and trust to represent their preferences. Make sure to ask this person for an agreement to act as your proxy. A proxy may have to exercise judgment in the event of a medical decision  for which your wishes are not known. A medical power of attorney is a legal document that names your health care proxy. Depending on the laws in your state, after the document is written, it may also need to be:  Signed.  Notarized.  Dated.  Copied.  Witnessed.  Incorporated into your medical record. You may also want to appoint someone to manage your money in a situation in which you are unable to do so. This is called a durable power of attorney for finances. It is a  separate legal document from the durable power of attorney for health care. You may choose the same person or someone different from your health care proxy to act as your agent in money matters. If you do not appoint a proxy, or if there is a concern that the proxy is not acting in your best interests, a court may appoint a guardian to act on your behalf. Living will A living will is a set of instructions that state your wishes about medical care when you cannot express them yourself. Health care providers should keep a copy of your living will in your medical record. You may want to give a copy to family members or friends. To alert caregivers in case of an emergency, you can place a card in your wallet to let them know that you have a living will and where they can find it. A living will is used if you become:  Terminally ill.  Disabled.  Unable to communicate or make decisions. Items to consider in your living will include:  To use or not to use life-support equipment, such as dialysis machines and breathing machines (ventilators).  A DNR or DNAR order. This tells health care providers not to use cardiopulmonary resuscitation (CPR) if breathing or heartbeat stops.  To use or not to use tube feeding.  To be given or not to be given food and fluids.  Comfort (palliative) care when the goal becomes comfort rather than a cure.  Donation of organs and tissues. A living will does not give instructions for distributing your money and property if you should pass away. DNR or DNAR A DNR or DNAR order is a request not to have CPR in the event that your heart stops beating or you stop breathing. If a DNR or DNAR order has not been made and shared, a health care provider will try to help any patient whose heart has stopped or who has stopped breathing. If you plan to have surgery, talk with your health care provider about how your DNR or DNAR order will be followed if problems occur. What if I do  not have an advance directive? If you do not have an advance directive, some states assign family decision makers to act on your behalf based on how closely you are related to them. Each state has its own laws about advance directives. You may want to check with your health care provider, attorney, or state representative about the laws in your state. Summary  Advance directives are the legal documents that allow you to make choices ahead of time about your health care and medical treatment in case you become unable to tell others about your care.  The process of discussing and writing advance directives should happen over time. You can change the advance directives, even after you have signed them.  Advance directives include DNR or DNAR orders, living wills, and designating an agent as your medical power of attorney. This information is  not intended to replace advice given to you by your health care provider. Make sure you discuss any questions you have with your health care provider. Document Revised: 05/25/2019 Document Reviewed: 05/25/2019 Elsevier Patient Education  [place].

## 2020-02-20 NOTE — Progress Notes (Signed)
MEDICARE ANNUAL WELLNESS VISIT  02/20/2020  Telephone Visit Disclaimer This Medicare AWV was conducted by telephone due to national recommendations for restrictions regarding the COVID-19 Pandemic (e.g. social distancing).  I verified, using two identifiers, that I am speaking with [name] or their authorized healthcare agent. I discussed the limitations, risks, security, and privacy concerns of performing an evaluation and management service by telephone and the potential availability of an in-person appointment in the future. The patient expressed understanding and agreed to proceed.   Subjective:  Krista Gonzales is a 69 y.o. female patient of [name], [name], FNP who had a Medicare Annual Wellness Visit today via telephone. Krista Gonzales is Retired and lives with their spouse. she has 2 children. she reports that she is socially active and does interact with friends/family regularly. she is minimally physically active and enjoys woodworking.  Patient Care Team: [name], FNP as PCP - General (Family Medicine)  Advanced Directives 02/20/2020  Does Patient Have a Medical Advance Directive? No  Would patient like information on creating a medical advance directive? Yes (MAU/Ambulatory/Procedural Areas - Information given)    Hospital Utilization Over the Past 12 Months: # of hospitalizations or ER visits: 0 # of surgeries: 0  Review of Systems    Patient reports that her overall health is unchanged compared to last year.  History obtained from chart review and the patient General ROS: negative  Patient Reported Readings (BP, Pulse, CBG, Weight, etc) none  Pain Assessment Pain : No/denies pain     Current Medications & Allergies (verified) Allergies as of 02/20/2020   No Known Allergies     Medication List       Accurate as of February 20, 2020 10:41 AM. If you have any questions, ask your nurse or doctor.        furosemide 40 MG tablet Commonly known  as: LASIX Take 1 tablet (40 mg total) by mouth 2 (two) times daily.   lisinopril 40 MG tablet Commonly known as: ZESTRIL TAKE 1 TABLET EVERY DAY   mirabegron ER 25 MG Tb24 tablet Commonly known as: MYRBETRIQ Take 1 tablet (25 mg total) by mouth daily.   multivitamin with minerals tablet Take 1 tablet by mouth daily.   Vitamin D (Ergocalciferol) 1.25 MG (50000 UNIT) Caps capsule Commonly known as: DRISDOL Take 1 capsule (50,000 Units total) by mouth every 7 (seven) days.       History (reviewed): Past Medical History:  Diagnosis Date  . Hypertension    Past Surgical History:  Procedure Laterality Date  . ABDOMINAL HYSTERECTOMY    . BREAST EXCISIONAL BIOPSY Right   . GALLBLADDER SURGERY    . HERNIA REPAIR    . TUMOR REMOVAL     History reviewed. No pertinent family history. Social History   Socioeconomic History  . Marital status: Married    Spouse name: [name]  . Number of children: 2  . Years of education: 63  . Highest education level: 12th grade  Occupational History  . Occupation: Retired  Tobacco Use  . Smoking status: Never Smoker  . Smokeless tobacco: Never Used  Substance and Sexual Activity  . Alcohol use: No  . Drug use: No  . Sexual activity: Not Currently  Other Topics Concern  . Not on file  Social History Narrative  . Not on file   Social Determinants of Health   Financial Resource Strain: Low Risk   . Difficulty of Paying Living Expenses: Not hard  at all  Food Insecurity: No Food Insecurity  . Worried About Charity fundraiser in the Last Year: Never true  . Ran Out of Food in the Last Year: Never true  Transportation Needs: No Transportation Needs  . Lack of Transportation (Medical): No  . Lack of Transportation (Non-Medical): No  Physical Activity: Insufficiently Active  . Days of Exercise per Week: 3 days  . Minutes of Exercise per Session: 30 min  Stress: No Stress Concern Present  . Feeling of Stress : Not at all  Social  Connections: Slightly Isolated  . Frequency of Communication with Friends and Family: More than three times a week  . Frequency of Social Gatherings with Friends and Family: More than three times a week  . Attends Religious Services: 1 to 4 times per year  . Active Member of Clubs or Organizations: No  . Attends Archivist Meetings: Never  . Marital Status: Married    Activities of Daily Living In your present state of health, do you have any difficulty performing the following activities: 02/20/2020  Hearing? N  Vision? N  Difficulty concentrating or making decisions? N  Walking or climbing stairs? N  Dressing or bathing? N  Doing errands, shopping? N  Preparing Food and eating ? N  Using the Toilet? N  In the past six months, have you accidently leaked urine? N  Do you have problems with loss of bowel control? N  Managing your Medications? N  Managing your Finances? N  Housekeeping or managing your Housekeeping? N  Some recent data might be hidden    Patient Education/ Literacy How often do you need to have someone help you when you read instructions, pamphlets, or other written materials from your doctor or pharmacy?: 1 - Never What is the last grade level you completed in school?: 12th Grade  Exercise Current Exercise Habits: Home exercise routine, Type of exercise: strength training/weights;treadmill, Time (Minutes): 30, Frequency (Times/Week): 3, Weekly Exercise (Minutes/Week): 90, Intensity: Mild, Exercise limited by: None identified  Diet Patient reports consuming 2 meals a day and 1 snack(s) a day Patient reports that her primary diet is: Regular Patient reports that she does have regular access to food.   Depression Screen PHQ 2/9 Scores 02/20/2020 02/14/2020 11/01/2019 07/13/2019 06/08/2019 09/07/2018 06/07/2018  PHQ - 2 Score 0 0 0 0 0 0 0     Fall Risk Fall Risk  02/20/2020 02/14/2020 11/01/2019 07/13/2019 06/08/2019  Falls in the past year? 0 0 0 0 0  Number  falls in past yr: 0 - - - -  Injury with Fall? 0 - - - -  Risk for fall due to : No Fall Risks - - - -  Follow up Falls evaluation completed - - - -     Objective:  [name] seemed alert and oriented and she participated appropriately during our telephone visit.  Blood Pressure Weight BMI  BP Readings from Last 3 Encounters:  02/14/20 107/69  11/01/19 110/65  07/13/19 111/71   Wt Readings from Last 3 Encounters:  02/14/20 165 lb 9.6 oz (75.1 kg)  11/01/19 159 lb (72.1 kg)  07/13/19 169 lb (76.7 kg)   BMI Readings from Last 1 Encounters:  02/14/20 28.43 kg/m    *Unable to obtain current vital signs, weight, and BMI due to telephone visit type  Hearing/Vision  . [place] did not seem to have difficulty with hearing/understanding during the telephone conversation . Reports that she has had a formal  eye exam by an eye care professional within the past year . Reports that she has not had a formal hearing evaluation within the past year *Unable to fully assess hearing and vision during telephone visit type  Cognitive Function: 6CIT Screen 02/20/2020  What Year? 0 points  What month? 0 points  What time? 0 points  Count back from 20 0 points  Months in reverse 0 points  Repeat phrase 0 points  Total Score 0   (Normal:0-7, Significant for Dysfunction: >8)  Normal Cognitive Function Screening: Yes   Immunization & Health Maintenance Record Immunization History  Administered Date(s) Administered  . Fluad Quad(high Dose 65+) 11/01/2019  . Influenza,inj,Quad PF,6+ Mos 11/19/2015, 09/07/2018  . Pneumococcal Conjugate-13 06/08/2019  . Tdap 08/22/2013    Health Maintenance  Topic Date Due  . DEXA SCAN  Never done  . PNA vac Low Risk Adult (2 of 2 - PPSV23) 06/07/2020  . INFLUENZA VACCINE  06/09/2020  . MAMMOGRAM  01/02/2021  . TETANUS/TDAP  08/23/2023  . COLONOSCOPY  08/17/2026  . Hepatitis C Screening  Completed  . COLON CANCER SCREENING ANNUAL FOBT   Discontinued       Assessment  This is a routine wellness examination for [facility]  Health Maintenance: Due or Overdue Health Maintenance Due  Topic Date Due  . DEXA SCAN  Never done    [facility] does not need a referral for Community Assistance: Care Management:   no Social Work:    no Prescription Assistance:  no Nutrition/Diabetes Education:  no   Plan:  Personalized Goals Goals Addressed            This Visit's Progress   . Have 3 meals a day       02/20/2020 AWV Goal: Improved Nutrition/Diet  . Patient will verbalize understanding that diet plays an important role in overall health and that a poor diet is a risk factor for many chronic medical conditions.  . Over the next year, patient will improve self management of their diet by incorporating improved meal pattern, less frequent dining out, more consistent meal timing, increased physical activity, improved protein intake, better food choices, and eat 6 small meals per day . . . Patient will utilize available community resources to help with food acquisition if needed (ex: food pantries, [address], etc) . Patient will work with nutrition specialist if a referral was made       Personalized Health Maintenance & Screening Recommendations  Pneumococcal vaccine  Screening mammography Bone densitometry screening Shingrix  Pneumovax23 due 06/07/2020  Lung Cancer Screening Recommended: no (Low Dose CT Chest recommended if Age 40-80 years, 30 pack-year currently smoking OR have quit w/in past 15 years) Hepatitis C Screening recommended: no HIV Screening recommended: no  Advanced Directives: Written information was prepared per patient's request.  Referrals & Orders No orders of the defined types were placed in this encounter.   Follow-up Plan . Follow-up with [name], FNP as planned . Schedule follow up to establish care with new PCP   I have personally reviewed and noted the  following in the patient's chart:   . Medical and social history . Use of alcohol, tobacco or illicit drugs  . Current medications and supplements . Functional ability and status . Nutritional status . Physical activity . Advanced directives . List of other physicians . Hospitalizations, surgeries, and ER visits in previous 12 months . Vitals . Screenings to include cognitive, depression, and falls . Referrals  and appointments  In addition, I have reviewed and discussed with [name] certain preventive protocols, quality metrics, and best practice recommendations. A written personalized care plan for preventive services as well as general preventive health recommendations is available and can be mailed to the patient at her request.      Wardell Heath, LPN  [id number]

## 2020-02-26 ENCOUNTER — Other Ambulatory Visit: Payer: Self-pay

## 2020-02-26 ENCOUNTER — Other Ambulatory Visit: Payer: Medicare HMO

## 2020-02-26 DIAGNOSIS — E2839 Other primary ovarian failure: Secondary | ICD-10-CM

## 2020-02-26 DIAGNOSIS — Z780 Asymptomatic menopausal state: Secondary | ICD-10-CM

## 2020-02-26 DIAGNOSIS — E559 Vitamin D deficiency, unspecified: Secondary | ICD-10-CM

## 2020-02-29 ENCOUNTER — Ambulatory Visit (INDEPENDENT_AMBULATORY_CARE_PROVIDER_SITE_OTHER): Payer: Medicare HMO

## 2020-02-29 ENCOUNTER — Other Ambulatory Visit: Payer: Medicare HMO

## 2020-02-29 ENCOUNTER — Other Ambulatory Visit: Payer: Self-pay | Admitting: Family

## 2020-02-29 ENCOUNTER — Other Ambulatory Visit: Payer: Self-pay

## 2020-02-29 DIAGNOSIS — Z780 Asymptomatic menopausal state: Secondary | ICD-10-CM

## 2020-02-29 DIAGNOSIS — Z13820 Encounter for screening for osteoporosis: Secondary | ICD-10-CM

## 2020-03-04 ENCOUNTER — Ambulatory Visit: Payer: Medicare Other | Admitting: Family Medicine

## 2020-03-04 ENCOUNTER — Other Ambulatory Visit: Payer: Medicare Other

## 2020-03-05 ENCOUNTER — Other Ambulatory Visit: Payer: Self-pay | Admitting: Family

## 2020-03-05 DIAGNOSIS — M810 Age-related osteoporosis without current pathological fracture: Secondary | ICD-10-CM

## 2020-03-05 MED ORDER — ALENDRONATE SODIUM 70 MG PO TABS: 70.0000 mg | ORAL_TABLET | ORAL | 11 refills | Status: DC

## 2020-03-07 ENCOUNTER — Ambulatory Visit (INDEPENDENT_AMBULATORY_CARE_PROVIDER_SITE_OTHER): Payer: Medicare HMO | Admitting: Pharmacist

## 2020-03-07 ENCOUNTER — Other Ambulatory Visit: Payer: Self-pay

## 2020-03-07 VITALS — BP 123/79 | HR 68

## 2020-03-07 DIAGNOSIS — M810 Age-related osteoporosis without current pathological fracture: Secondary | ICD-10-CM

## 2020-03-07 NOTE — Progress Notes (Signed)
    Pharmacy Clinic Osteoporosis  03/07/2020 Name: Krista Gonzales MRN: [id number] DOB: [birth year]  Ethnicity:Caucasian  BP 123/79 HR: 68  HPI: Does pt already have a diagnosis of:  Osteopenia?  Yes Osteoporosis?  No  Back Pain?  No       Kyphosis?  No Prior fracture?  Yes  Med(s) for Osteoporosis/Osteopenia:  Vitamin D,calcium (unsure of calcium mg) Med(s) previously tried for Osteoporosis/Osteopenia:  n/a                                                             PMH: Hysterectomy?  No Oophorectomy?  No HRT? No Steroid Use?  No Thyroid med?  No History of cancer?  No History of digestive disorders (ie Crohn's)?  No Current or previous eating disorders?  No Last Vitamin D Result:  40.7 Last GFR Result:  48   FH/SH: Family history of osteoporosis?  No Parent with history of hip fracture?  No Family history of breast cancer?  No Exercise?  No Smoking?  No Alcohol?  No    DEXA Results Date of Test T-Score for AP Spine L1-L4 T-Score for Total Left Hip T-Score for Total Right Hip  02/29/20 -1.4 -1.9 -1.6                  FRAX 10 year estimate: Total FX risk:  20.7%  (consider medication if >/= 20%) Hip FX risk:  4.3%  (consider medication if >/= 3%)  Assessment: Osteopenic (total T-2.4), increased FRAX risk   Recommendations: 1.  Start  alendronate (FOSAMAX) weekly 2.  recommend calcium 1200mg  daily through supplementation or diet.  3. Continue vitamin D 50,000 units weekly + 2000-5000units per day of vitamin D per PCP 4.  recommend weight bearing exercise - 30 minutes at least 4 days per week.   5.  Counseled and educated about fall risk and prevention. 6.  Counseled on new bisphosphonate therapy and calcium/vit D  Recheck DEXA:  2 years  Time spent counseling patient:  25 minutes  Regina Eck, PharmD, BCPS Clinical Pharmacist, [place]  [facility] Phone [phone]

## 2020-03-14 ENCOUNTER — Other Ambulatory Visit: Payer: Self-pay

## 2020-03-14 ENCOUNTER — Ambulatory Visit
Admission: RE | Admit: 2020-03-14 | Discharge: 2020-03-14 | Disposition: A | Discharge status: Home / Self Care | Payer: Medicare HMO | Source: Ambulatory Visit | Attending: Family Medicine | Admitting: Family Medicine

## 2020-03-14 DIAGNOSIS — Z1231 Encounter for screening mammogram for malignant neoplasm of breast: Secondary | ICD-10-CM

## 2020-04-15 ENCOUNTER — Other Ambulatory Visit: Payer: Self-pay | Admitting: Family

## 2020-05-14 ENCOUNTER — Ambulatory Visit (INDEPENDENT_AMBULATORY_CARE_PROVIDER_SITE_OTHER): Payer: Medicare HMO | Admitting: Family Medicine

## 2020-05-14 ENCOUNTER — Encounter: Payer: Self-pay | Admitting: Family Medicine

## 2020-05-14 ENCOUNTER — Other Ambulatory Visit: Payer: Self-pay

## 2020-05-14 VITALS — BP 155/78 | HR 74 | Temp 98.1°F | Resp 20 | Ht 64.0 in | Wt 186.1 lb

## 2020-05-14 DIAGNOSIS — I83893 Varicose veins of bilateral lower extremities with other complications: Secondary | ICD-10-CM

## 2020-05-14 DIAGNOSIS — R600 Localized edema: Secondary | ICD-10-CM | POA: Diagnosis not present

## 2020-05-14 LAB — CBC WITH DIFFERENTIAL/PLATELET
Basophils Absolute: 0.1 10*3/uL (ref 0.0–0.2)
Basos: 1 %
EOS (ABSOLUTE): 0.4 10*3/uL (ref 0.0–0.4)
Eos: 7 %
Hematocrit: 39.1 % (ref 34.0–46.6)
Hemoglobin: 12.7 g/dL (ref 11.1–15.9)
Immature Grans (Abs): 0 10*3/uL (ref 0.0–0.1)
Immature Granulocytes: 0 %
Lymphocytes Absolute: 1.4 10*3/uL (ref 0.7–3.1)
Lymphs: 26 %
MCH: 29.3 pg (ref 26.6–33.0)
MCHC: 32.5 g/dL (ref 31.5–35.7)
MCV: 90 fL (ref 79–97)
Monocytes Absolute: 0.4 10*3/uL (ref 0.1–0.9)
Monocytes: 7 %
Neutrophils Absolute: 3.2 10*3/uL (ref 1.4–7.0)
Neutrophils: 59 %
Platelets: 265 10*3/uL (ref 150–450)
RBC: 4.33 x10E6/uL (ref 3.77–5.28)
RDW: 13.4 % (ref 11.7–15.4)
WBC: 5.4 10*3/uL (ref 3.4–10.8)

## 2020-05-14 LAB — CMP14+EGFR
ALT: 16 IU/L (ref 0–32)
AST: 19 IU/L (ref 0–40)
Albumin/Globulin Ratio: 1.9 (ref 1.2–2.2)
Albumin: 3.7 g/dL — ABNORMAL LOW (ref 3.8–4.8)
Alkaline Phosphatase: 62 IU/L (ref 48–121)
BUN/Creatinine Ratio: 21 (ref 12–28)
BUN: 16 mg/dL (ref 8–27)
Bilirubin Total: 0.2 mg/dL (ref 0.0–1.2)
CO2: 25 mmol/L (ref 20–29)
Calcium: 8.8 mg/dL (ref 8.7–10.3)
Chloride: 109 mmol/L — ABNORMAL HIGH (ref 96–106)
Creatinine, Ser: 0.78 mg/dL (ref 0.57–1.00)
GFR calc Af Amer: 92 mL/min/{1.73_m2} (ref >59)
GFR calc non Af Amer: 79 mL/min/{1.73_m2} (ref >59)
Globulin, Total: 2 g/dL (ref 1.5–4.5)
Glucose: 89 mg/dL (ref 65–99)
Potassium: 4.2 mmol/L (ref 3.5–5.2)
Sodium: 146 mmol/L — ABNORMAL HIGH (ref 134–144)
Total Protein: 5.7 g/dL — ABNORMAL LOW (ref 6.0–8.5)

## 2020-05-14 NOTE — Progress Notes (Signed)
Subjective:  Patient ID: Krista Gonzales, female    DOB: 04/15/1951  Age: 69 y.o. MRN: [id number]  CC: No chief complaint on file.   HPI Krista Gonzales presents for swelling in the legs for the last 4 days.  This has not been a problem before.  However she does have varicose veins and has been looking at getting those treated.  Over the last month she has been donating plasma asked for extra money twice weekly.  She now feels rather wiped out and wonders if the swelling is coming from that process.  She denies any cramping in the legs.  She has gained 9 pounds.  She denies shortness of breath and chest pain.  No cough.  Depression screen [facility] 2/9 05/14/2020 02/20/2020 02/14/2020  Decreased Interest 0 0 0  Down, Depressed, Hopeless 0 0 0  PHQ - 2 Score 0 0 0    History Krista Gonzales has a past medical history of Hypertension.   She has a past surgical history that includes Gallbladder surgery; Tumor removal; Breast excisional biopsy (Right); Abdominal hysterectomy; and Hernia repair.   Her family history is not on file.She reports that she has never smoked. She has never used smokeless tobacco. She reports that she does not drink alcohol and does not use drugs.    ROS Review of Systems  Constitutional: Negative.   HENT: Negative.   Eyes: Negative for visual disturbance.  Respiratory: Negative for shortness of breath.   Cardiovascular: Positive for leg swelling. Negative for chest pain.  Gastrointestinal: Negative for abdominal pain.  Musculoskeletal: Negative for arthralgias.    Objective:  BP (!) 155/78   Pulse 74   Temp 98.1 F (36.7 C) (Temporal)   Resp 20   Ht _0  (1.626 m)   Wt 186 lb 2 oz (84.4 kg)   SpO2 97%   BMI 31.95 kg/m   BP Readings from Last 3 Encounters:  05/14/20 (!) 155/78  03/07/20 123/79  02/14/20 107/69    Wt Readings from Last 3 Encounters:  05/14/20 186 lb 2 oz (84.4 kg)  02/14/20 165 lb 9.6 oz (75.1 kg)  11/01/19 159 lb (72.1 kg)      Physical Exam Constitutional:      General: She is not in acute distress.    Appearance: She is well-developed.  Cardiovascular:     Rate and Rhythm: Normal rate and regular rhythm.  Pulmonary:     Breath sounds: Normal breath sounds.  Skin:    General: Skin is warm and dry.     Findings: Lesion (  Large blue varicosities noted on the shins and calves of both lower extremities.) present.  Neurological:     Mental Status: She is alert and oriented to person, place, and time.  Psychiatric:        Mood and Affect: Mood normal.       Assessment & Plan:   Diagnoses and all orders for this visit:  Localized edema -     CBC with Differential/Platelet -     CMP14+EGFR -     Ambulatory referral to Vascular Surgery  Varicose veins of both legs with edema -     CBC with Differential/Platelet -     CMP14+EGFR -     Ambulatory referral to Vascular Surgery    Increase her furosemide to 2 a day for the next 3 days.  Then reduce to 1 a day as that has been her baseline dose recently.  Will adjust based  on future problems with swelling and results of her blood work today.   I am having [place] maintain her multivitamin with minerals, Vitamin D (Ergocalciferol), furosemide, mirabegron ER, lisinopril, alendronate, and Caltrate 600+D Plus Minerals.  Allergies as of 05/14/2020   No Known Allergies     Medication List       Accurate as of May 14, 2020  9:50 AM. If you have any questions, ask your nurse or doctor.        alendronate 70 MG tablet Commonly known as: Fosamax Take 1 tablet (70 mg total) by mouth every 7 (seven) days. Take with a full glass of water on an empty stomach.   Caltrate 600+D Plus Minerals 600-800 MG-UNIT Tabs Take 1 tablet by mouth in the morning and at bedtime.   furosemide 40 MG tablet Commonly known as: LASIX Take 1 tablet (40 mg total) by mouth 2 (two) times daily. What changed: when to take this   lisinopril 40 MG tablet Commonly  known as: ZESTRIL TAKE 1 TABLET EVERY DAY   mirabegron ER 25 MG Tb24 tablet Commonly known as: MYRBETRIQ Take 1 tablet (25 mg total) by mouth daily.   multivitamin with minerals tablet Take 1 tablet by mouth daily.   Vitamin D (Ergocalciferol) 1.25 MG (50000 UNIT) Caps capsule Commonly known as: DRISDOL Take 1 capsule (50,000 Units total) by mouth every 7 (seven) days.        Follow-up: No follow-ups on file.  Claretta Fraise, M.D.

## 2020-05-15 NOTE — Progress Notes (Signed)
Hello Catha,  Your lab result is normal and/or stable.Some minor variations that are not significant are commonly marked abnormal, but do not represent any medical problem for you.  Best regards, Claretta Fraise, M.D.

## 2020-05-28 ENCOUNTER — Telehealth: Payer: Self-pay | Admitting: Family Medicine

## 2020-05-28 NOTE — Telephone Encounter (Signed)
Called office they are backed up and should be calling the patient in the next couple of day - left message to advise the patient

## 2020-06-10 ENCOUNTER — Other Ambulatory Visit: Payer: Self-pay | Admitting: *Deleted

## 2020-06-10 DIAGNOSIS — R609 Edema, unspecified: Secondary | ICD-10-CM

## 2020-06-11 MED ORDER — FUROSEMIDE 40 MG PO TABS: 40.0000 mg | ORAL_TABLET | Freq: Every day | ORAL | 1 refills | Status: DC

## 2020-06-20 ENCOUNTER — Encounter (HOSPITAL_COMMUNITY): Payer: Medicare HMO

## 2020-06-25 ENCOUNTER — Other Ambulatory Visit: Payer: Self-pay

## 2020-06-25 DIAGNOSIS — M7989 Other specified soft tissue disorders: Secondary | ICD-10-CM

## 2020-06-27 ENCOUNTER — Ambulatory Visit: Payer: Medicare HMO

## 2020-07-08 ENCOUNTER — Encounter (HOSPITAL_COMMUNITY): Payer: Medicare HMO

## 2020-07-08 ENCOUNTER — Ambulatory Visit (HOSPITAL_COMMUNITY)
Admission: RE | Admit: 2020-07-08 | Discharge: 2020-07-08 | Disposition: A | Discharge status: Home / Self Care | Payer: Medicare HMO | Source: Ambulatory Visit | Attending: Family | Admitting: Family

## 2020-07-08 ENCOUNTER — Ambulatory Visit (INDEPENDENT_AMBULATORY_CARE_PROVIDER_SITE_OTHER): Payer: Medicare HMO | Admitting: Physician Assistant

## 2020-07-08 ENCOUNTER — Other Ambulatory Visit: Payer: Self-pay

## 2020-07-08 VITALS — BP 107/70 | HR 75 | Temp 98.2°F | Resp 20 | Ht 64.0 in | Wt 180.5 lb

## 2020-07-08 DIAGNOSIS — M25552 Pain in left hip: Secondary | ICD-10-CM

## 2020-07-08 DIAGNOSIS — M7989 Other specified soft tissue disorders: Secondary | ICD-10-CM | POA: Diagnosis not present

## 2020-07-08 DIAGNOSIS — I8393 Asymptomatic varicose veins of bilateral lower extremities: Secondary | ICD-10-CM

## 2020-07-08 NOTE — Progress Notes (Signed)
Office Note     CC:  follow up Requesting Provider:  Sharion Balloon, FNP  HPI: [facility] Krista Gonzales is a 69 y.o. ([birth year]) female who presents for evaluation of edema, pain, and spider veins of bilateral lower extremities.  Patient states that she has noticed more prominent spider veins of bilateral lower extremities over the past couple months.  Pain is mostly in left hip especially at the beginning of walking however this resolves as her "leg loosens up" Over the past 10 years she has worn compression knee-high on and off which only offered limited relief.  She admittedly does not elevate her legs much during the day.  She takes Lasix which seems to help with some of her swelling.  She denies any history of venous ulcerations, DVT, vascular interventions, or trauma.  She denies claudication, rest pain, or nonhealing wounds of bilateral lower extremities.  She denies tobacco use.      Past Medical History:  Diagnosis Date  . Hypertension     Past Surgical History:  Procedure Laterality Date  . ABDOMINAL HYSTERECTOMY    . BREAST EXCISIONAL BIOPSY Right   . GALLBLADDER SURGERY    . HERNIA REPAIR    . TUMOR REMOVAL      Social History   Socioeconomic History  . Marital status: Married    Spouse name: [name]  . Number of children: 2  . Years of education: 65  . Highest education level: 12th grade  Occupational History  . Occupation: Retired  Tobacco Use  . Smoking status: Never Smoker  . Smokeless tobacco: Never Used  Substance and Sexual Activity  . Alcohol use: No  . Drug use: No  . Sexual activity: Not Currently  Other Topics Concern  . Not on file  Social History Narrative  . Not on file   Social Determinants of Health   Financial Resource Strain: Low Risk   . Difficulty of Paying Living Expenses: Not hard at all  Food Insecurity: No Food Insecurity  . Worried About Charity fundraiser in the Last Year: Never true  . Ran Out of Food in the Last Year: Never  true  Transportation Needs: No Transportation Needs  . Lack of Transportation (Medical): No  . Lack of Transportation (Non-Medical): No  Physical Activity: Insufficiently Active  . Days of Exercise per Week: 3 days  . Minutes of Exercise per Session: 30 min  Stress: No Stress Concern Present  . Feeling of Stress : Not at all  Social Connections: Moderately Integrated  . Frequency of Communication with Friends and Family: More than three times a week  . Frequency of Social Gatherings with Friends and Family: More than three times a week  . Attends Religious Services: 1 to 4 times per year  . Active Member of Clubs or Organizations: No  . Attends Archivist Meetings: Never  . Marital Status: Married  Human resources officer Violence: Not At Risk  . Fear of Current or Ex-Partner: No  . Emotionally Abused: No  . Physically Abused: No  . Sexually Abused: No   History reviewed. No pertinent family history.  Current Outpatient Medications  Medication Sig Dispense Refill  . furosemide (LASIX) 40 MG tablet Take 1 tablet (40 mg total) by mouth daily. 180 tablet 1  . lisinopril (ZESTRIL) 40 MG tablet TAKE 1 TABLET EVERY DAY 90 tablet 1  . Multiple Vitamins-Minerals (MULTIVITAMIN WITH MINERALS) tablet Take 1 tablet by mouth daily. (Patient not taking: Reported on 05/14/2020)    .  Vitamin D, Ergocalciferol, (DRISDOL) 1.25 MG (50000 UT) CAPS capsule Take 1 capsule (50,000 Units total) by mouth every 7 (seven) days. (Patient not taking: Reported on 05/14/2020) 12 capsule 2   No current facility-administered medications for this visit.    No Known Allergies   REVIEW OF SYSTEMS:   [X]  denotes positive finding, [ ]  denotes negative finding Cardiac  Comments:  Chest pain or chest pressure:    Shortness of breath upon exertion:    Short of breath when lying flat:    Irregular heart rhythm:        Vascular    Pain in calf, thigh, or hip brought on by ambulation:    Pain in feet at night  that wakes you up from your sleep:     Blood clot in your veins:    Leg swelling:         Pulmonary    Oxygen at home:    Productive cough:     Wheezing:         Neurologic    Sudden weakness in arms or legs:     Sudden numbness in arms or legs:     Sudden onset of difficulty speaking or slurred speech:    Temporary loss of vision in one eye:     Problems with dizziness:         Gastrointestinal    Blood in stool:     Vomited blood:         Genitourinary    Burning when urinating:     Blood in urine:        Psychiatric    Major depression:         Hematologic    Bleeding problems:    Problems with blood clotting too easily:        Skin    Rashes or ulcers:        Constitutional    Fever or chills:      PHYSICAL EXAMINATION:  Vitals:   07/08/20 1356  BP: 107/70  Pulse: 75  Resp: 20  Temp: 98.2 F (36.8 C)  TempSrc: Temporal  SpO2: 97%  Weight: 180 lb 8 oz (81.9 kg)  Height: 5\' 4"  (1.626 m)    General:  WDWN in NAD; vital signs documented above Gait: Not observed HENT: WNL, normocephalic Pulmonary: normal non-labored breathing , without Rales, rhonchi,  wheezing Cardiac: regular HR Abdomen: soft, NT, no masses Skin: without rashes Vascular Exam/Pulses:  Right Left  Radial 2+ (normal) 2+ (normal)  DP 2+ (normal) 2+ (normal)   Extremities: without ischemic changes, without Gangrene , without cellulitis; without open wounds; no venous ulcerations; no nonhealing wounds; no significant edema of bilateral lower extremities; spider veins pictured below Musculoskeletal: no muscle wasting or atrophy  Neurologic: A&O X 3;  No focal weakness or paresthesias are detected Psychiatric:  The pt has Normal affect.        Non-Invasive Vascular Imaging:   Bilateral venous reflux study  R Negative for DVT Negative for deep reflux GSV reflux only in proximal calf  L Negative for DVT Negative for deep reflux GSV reflux at saphenofemoral junction and  proximal calf   ASSESSMENT/PLAN:: 69 y.o. female here for evaluation of spider veins, edema, and left leg pain  Bilateral lower extremity venous reflux study negative for DVT and deep reflux Minimal superficial reflux bilaterally; patient would not be a candidate for laser ablation currently Recommended regularly use of knee-high 15 to 20 mmHg compression and periodic  elevation during the day NSAIDs recommended for venous symptoms Based on exam and patient's history differential diagnosis for left lower extremity pain includes left hip osteoarthritis; patient will discuss this with her primary care physician We also discussed briefly sclerotherapy; Estell Harpin, RN will contact patient to further discuss Patient can otherwise follow-up on an as-needed basis   Dagoberto Ligas, PA-C [facility] [phone]  Clinic MD:   Trula Slade

## 2020-07-11 ENCOUNTER — Telehealth: Payer: Self-pay

## 2020-07-11 NOTE — Telephone Encounter (Signed)
Spoke to pt about sclerotherapy. She is going to call back if she decides to move forward with scheduling tx.

## 2020-09-02 ENCOUNTER — Encounter: Payer: Self-pay | Admitting: Family

## 2020-09-02 ENCOUNTER — Ambulatory Visit (INDEPENDENT_AMBULATORY_CARE_PROVIDER_SITE_OTHER): Payer: Medicare HMO

## 2020-09-02 ENCOUNTER — Ambulatory Visit (INDEPENDENT_AMBULATORY_CARE_PROVIDER_SITE_OTHER): Payer: Medicare HMO | Admitting: Family

## 2020-09-02 ENCOUNTER — Other Ambulatory Visit: Payer: Self-pay

## 2020-09-02 VITALS — BP 116/73 | HR 73 | Temp 98.1°F | Ht 64.0 in | Wt 188.8 lb

## 2020-09-02 DIAGNOSIS — M79605 Pain in left leg: Secondary | ICD-10-CM

## 2020-09-02 DIAGNOSIS — M25559 Pain in unspecified hip: Secondary | ICD-10-CM

## 2020-09-02 DIAGNOSIS — Z23 Encounter for immunization: Secondary | ICD-10-CM

## 2020-09-02 DIAGNOSIS — K5900 Constipation, unspecified: Secondary | ICD-10-CM

## 2020-09-02 DIAGNOSIS — M25552 Pain in left hip: Secondary | ICD-10-CM | POA: Diagnosis not present

## 2020-09-02 DIAGNOSIS — I959 Hypotension, unspecified: Secondary | ICD-10-CM

## 2020-09-02 MED ORDER — POLYETHYLENE GLYCOL 3350 17 G PO PACK: 17.0000 g | PACK | Freq: Every day | ORAL | 3 refills | Status: AC

## 2020-09-02 MED ORDER — FUROSEMIDE 20 MG PO TABS
20.0000 mg | ORAL_TABLET | Freq: Every day | ORAL | 3 refills | Status: DC
Start: 2020-09-02 — End: 2020-10-31

## 2020-09-02 NOTE — Progress Notes (Signed)
Subjective:    Patient ID: Krista Gonzales, female    DOB: Feb 14, 1951, 69 y.o.   MRN: [id number]  Chief Complaint  Patient presents with  . Hypotension  . Constipation    last BM Sauturday had to use enama   . Leg Pain    left leg pain   Pt presents to the office today with hypotension. She reports when she is doing outside work recently she will feel her heart racing and will go in and check her BP and it will 88/48. She will rest and eat and drink and would return back to normal.  Constipation This is a chronic problem. The current episode started 1 to 4 weeks ago. The problem has been waxing and waning since onset. Her stool frequency is 1 time per day. She has tried laxatives for the symptoms. The treatment provided moderate relief.  Leg Pain  The incident occurred more than 1 week ago. There was no injury mechanism. The pain is present in the left thigh. The quality of the pain is described as aching. The pain is at a severity of 8/10 (when laying ). The pain is moderate. The pain has been intermittent since onset. Pertinent negatives include no numbness or tingling. She reports no foreign bodies present. She has tried NSAIDs for the symptoms. The treatment provided mild relief.      Review of Systems  Gastrointestinal: Positive for constipation.  Neurological: Negative for tingling and numbness.  All other systems reviewed and are negative.      Objective:   Physical Exam Vitals reviewed.  Constitutional:      General: She is not in acute distress.    Appearance: She is well-developed.  HENT:     Head: Normocephalic and atraumatic.     Right Ear: Tympanic membrane normal.     Left Ear: Tympanic membrane normal.  Eyes:     Pupils: Pupils are equal, round, and reactive to light.  Neck:     Thyroid: No thyromegaly.  Cardiovascular:     Rate and Rhythm: Normal rate and regular rhythm.     Heart sounds: Normal heart sounds. No murmur heard.   Pulmonary:      Effort: Pulmonary effort is normal. No respiratory distress.     Breath sounds: Normal breath sounds. No wheezing.  Abdominal:     General: Bowel sounds are normal. There is no distension.     Palpations: Abdomen is soft.     Tenderness: There is no abdominal tenderness.  Musculoskeletal:        General: No tenderness. Normal range of motion.     Cervical back: Normal range of motion and neck supple.  Skin:    General: Skin is warm and dry.  Neurological:     Mental Status: She is alert and oriented to person, place, and time.     Cranial Nerves: No cranial nerve deficit.     Deep Tendon Reflexes: Reflexes are normal and symmetric.  Psychiatric:        Behavior: Behavior normal.        Thought Content: Thought content normal.        Judgment: Judgment normal.       BP 116/73   Pulse 73   Temp 98.1 F (36.7 C) (Temporal)   Ht 5\' 4"  (1.626 m)   Wt 188 lb 12.8 oz (85.6 kg)   SpO2 97%   BMI 32.41 kg/m      Assessment & Plan:  [facility]  Thurston Pounds comes in today with chief complaint of Hypotension, Constipation (last BM Sauturday had to use enama ), and Leg Pain (left leg pain)   Diagnosis and orders addressed:  1. Need for immunization against influenza - Flu Vaccine QUAD High Dose(Fluad)  2. Hypotension, unspecified hypotension type Will decrease Lasix to 20 mg. I think she is becoming dehydrated. No edema on exam. We discussed decreasing to 20 mg and if no edema present decrease to as needed.  Make sure to stay hydrated  3. Pain of left lower extremity If arthritis present will give NSAID - DG HIP UNILAT W OR W/O PELVIS 2-3 VIEWS LEFT; Future  4. Constipation, unspecified constipation type Will increase Miralax BID. If this does not work will try [place]. Force fluids  Encouraged high fiber and exercises  - polyethylene glycol (MIRALAX / GLYCOLAX) 17 g packet; Take 17 g by mouth daily.  Dispense: 90 each; Refill: 3  5. Hip pain - DG HIP UNILAT W OR W/O PELVIS  2-3 VIEWS LEFT; Future   Labs pending Health Maintenance reviewed Diet and exercise encouraged  Follow up plan: 4 weeks   Evelina Dun, FNP

## 2020-09-02 NOTE — Patient Instructions (Signed)
Hypotension As your heart beats, it forces blood through your body. Hypotension, commonly called low blood pressure, is when the force of blood pumping through your arteries is too weak. Arteries are blood vessels that carry blood from the heart throughout the body. Depending on the cause and severity, hypotension may be harmless (benign) or may cause serious problems (be critical). When blood pressure is too low, you may not get enough blood to your brain or to the rest of your organs. This can cause weakness, light-headedness, rapid heartbeat, and fainting. What are the causes? This condition may be caused by:  Blood loss.  Loss of body fluids (dehydration).  Heart problems.  Hormone (endocrine) problems.  Pregnancy.  Severe infection.  Lack of certain nutrients.  Severe allergic reactions (anaphylaxis).  Certain medicines, such as blood pressure medicine or medicines that make the body lose excess fluids (diuretics). Sometimes, hypotension may be caused by not taking medicine as directed, such as taking too much of a certain medicine. What increases the risk? The following factors may make you more likely to develop this condition:  Age. Risk increases as you get older.  Conditions that affect the heart or the central nervous system.  Taking certain medicines, such as blood pressure medicine or diuretics.  Being pregnant. What are the signs or symptoms? Common symptoms of this condition include:  Weakness.  Light-headedness.  Dizziness.  Blurred vision.  Fatigue.  Rapid heartbeat.  Fainting, in severe cases. How is this diagnosed? This condition is diagnosed based on:  Your medical history.  Your symptoms.  Your blood pressure measurement. Your health care provider will check your blood pressure when you are: ? Lying down. ? Sitting. ? Standing. A blood pressure reading is recorded as two numbers, such as "120 over 80" (or 120/80). The first ("top")  number is called the systolic pressure. It is a measure of the pressure in your arteries as your heart beats. The second ("bottom") number is called the diastolic pressure. It is a measure of the pressure in your arteries when your heart relaxes between beats. Blood pressure is measured in a unit called mm Hg. Healthy blood pressure for most adults is 120/80. If your blood pressure is below 90/60, you may be diagnosed with hypotension. Other information or tests that may be used to diagnose hypotension include:  Your other vital signs, such as your heart rate and temperature.  Blood tests.  Tilt table test. For this test, you will be safely secured to a table that moves you from a lying position to an upright position. Your heart rhythm and blood pressure will be monitored during the test. How is this treated? Treatment for this condition may include:  Changing your diet. This may involve eating more salt (sodium) or drinking more water.  Taking medicines to raise your blood pressure.  Changing the dosage of certain medicines you are taking that might be lowering your blood pressure.  Wearing compression stockings. These stockings help to prevent blood clots and reduce swelling in your legs. In some cases, you may need to go to the hospital for:  Fluid replacement. This means you will receive fluids through an IV.  Blood replacement. This means you will receive donated blood through an IV (transfusion).  Treating an infection or heart problems, if this applies.  Monitoring. You may need to be monitored while medicines that you are taking wear off. Follow these instructions at home: Eating and drinking   Drink enough fluid to keep your  urine pale yellow.  Eat a healthy diet, and follow instructions from your health care provider about eating or drinking restrictions. A healthy diet includes: ? Fresh fruits and vegetables. ? Whole grains. ? Lean meats. ? Low-fat dairy  products.  Eat extra salt only as directed. Do not add extra salt to your diet unless your health care provider told you to do that.  Eat frequent, small meals.  Avoid standing up suddenly after eating. Medicines  Take over-the-counter and prescription medicines only as told by your health care provider. ? Follow instructions from your health care provider about changing the dosage of your current medicines, if this applies. ? Do not stop or adjust any of your medicines on your own. General instructions   Wear compression stockings as told by your health care provider.  Get up slowly from lying down or sitting positions. This gives your blood pressure a chance to adjust.  Avoid hot showers and excessive heat as directed by your health care provider.  Return to your normal activities as told by your health care provider. Ask your health care provider what activities are safe for you.  Do not use any products that contain nicotine or tobacco, such as cigarettes, e-cigarettes, and chewing tobacco. If you need help quitting, ask your health care provider.  Keep all follow-up visits as told by your health care provider. This is important. Contact a health care provider if you:  Vomit.  Have diarrhea.  Have a fever for more than 2-3 days.  Feel more thirsty than usual.  Feel weak and tired. Get help right away if you:  Have chest pain.  Have a fast or irregular heartbeat.  Develop numbness in any part of your body.  Cannot move your arms or your legs.  Have trouble speaking.  Become sweaty or feel light-headed.  Faint.  Feel short of breath.  Have trouble staying awake.  Feel confused. Summary  Hypotension is when the force of blood pumping through your arteries is too weak.  Hypotension may be harmless (benign) or may cause serious problems (be critical).  Treatment for this condition may include changing your diet, changing your medicines, and wearing  compression stockings.  In some cases, you may need to go to the hospital for fluid or blood replacement. This information is not intended to replace advice given to you by your health care provider. Make sure you discuss any questions you have with your health care provider. Document Revised: 04/21/2018 Document Reviewed: 04/21/2018 Elsevier Patient Education  [place]. Hip Exercises Ask your health care provider which exercises are safe for you. Do exercises exactly as told by your health care provider and adjust them as directed. It is normal to feel mild stretching, pulling, tightness, or discomfort as you do these exercises. Stop right away if you feel sudden pain or your pain gets worse. Do not begin these exercises until told by your health care provider. Stretching and range-of-motion exercises These exercises warm up your muscles and joints and improve the movement and flexibility of your hip. These exercises also help to relieve pain, numbness, and tingling. You may be asked to limit your range of motion if you had a hip replacement. Talk to your health care provider about these restrictions. Hamstrings, supine  1. Lie on your back (supine position). 2. Loop a belt or towel over the ball of your left / right foot. The ball of your foot is on the walking surface, right under your toes.  3. Straighten your left / right knee and slowly pull on the belt or towel to raise your leg until you feel a gentle stretch behind your knee (hamstring). ? Do not let your knee bend while you do this. ? Keep your other leg flat on the floor. 4. Hold this position for __________ seconds. 5. Slowly return your leg to the starting position. Repeat __________ times. Complete this exercise __________ times a day. Hip rotation  1. Lie on your back on a firm surface. 2. With your left / right hand, gently pull your left / right knee toward the shoulder that is on the same side of the body. Stop when  your knee is pointing toward the ceiling. 3. Hold your left / right ankle with your other hand. 4. Keeping your knee steady, gently pull your left / right ankle toward your other shoulder until you feel a stretch in your buttocks. ? Keep your hips and shoulders firmly planted while you do this stretch. 5. Hold this position for __________ seconds. Repeat __________ times. Complete this exercise __________ times a day. Seated stretch This exercise is sometimes called hamstrings and adductors stretch. 1. Sit on the floor with your legs stretched wide. Keep your knees straight during this exercise. 2. Keeping your head and back in a straight line, bend at your waist to reach for your left foot (position A). You should feel a stretch in your right inner thigh (adductors). 3. Hold this position for __________ seconds. Then slowly return to the upright position. 4. Keeping your head and back in a straight line, bend at your waist to reach forward (position B). You should feel a stretch behind both of your thighs and knees (hamstrings). 5. Hold this position for __________ seconds. Then slowly return to the upright position. 6. Keeping your head and back in a straight line, bend at your waist to reach for your right foot (position C). You should feel a stretch in your left inner thigh (adductors). 7. Hold this position for __________ seconds. Then slowly return to the upright position. Repeat __________ times. Complete this exercise __________ times a day. Lunge This exercise stretches the muscles of the hip (hip flexors). 1. Place your left / right knee on the floor and bend your other knee so that is directly over your ankle. You should be half-kneeling. 2. Keep good posture with your head over your shoulders. 3. Tighten your buttocks to point your tailbone downward. This will prevent your back from arching too much. 4. You should feel a gentle stretch in the front of your left / right thigh and hip.  If you do not feel a stretch, slide your other foot forward slightly and then slowly lunge forward with your chest up until your knee once again lines up over your ankle. ? Make sure your tailbone continues to point downward. 5. Hold this position for __________ seconds. 6. Slowly return to the starting position. Repeat __________ times. Complete this exercise __________ times a day. Strengthening exercises These exercises build strength and endurance in your hip. Endurance is the ability to use your muscles for a long time, even after they get tired. Bridge This exercise strengthens the muscles of your hip (hip extensors). 1. Lie on your back on a firm surface with your knees bent and your feet flat on the floor. 2. Tighten your buttocks muscles and lift your bottom off the floor until the trunk of your body and your hips are level with your thighs. ?  Do not arch your back. ? You should feel the muscles working in your buttocks and the back of your thighs. If you do not feel these muscles, slide your feet 1-2 inches (2.5-5 cm) farther away from your buttocks. 3. Hold this position for __________ seconds. 4. Slowly lower your hips to the starting position. 5. Let your muscles relax completely between repetitions. Repeat __________ times. Complete this exercise __________ times a day. Straight leg raises, side-lying This exercise strengthens the muscles that move the hip joint away from the center of the body (hip abductors). 1. Lie on your side with your left / right leg in the top position. Lie so your head, shoulder, hip, and knee line up. You may bend your bottom knee slightly to help you balance. 2. Roll your hips slightly forward, so your hips are stacked directly over each other and your left / right knee is facing forward. 3. Leading with your heel, lift your top leg 4-6 inches (10-15 cm). You should feel the muscles in your top hip lifting. ? Do not let your foot drift forward. ? Do  not let your knee roll toward the ceiling. 4. Hold this position for __________ seconds. 5. Slowly return to the starting position. 6. Let your muscles relax completely between repetitions. Repeat __________ times. Complete this exercise __________ times a day. Straight leg raises, side-lying This exercise strengthens the muscles that move the hip joint toward the center of the body (hip adductors). 1. Lie on your side with your left / right leg in the bottom position. Lie so your head, shoulder, hip, and knee line up. You may place your upper foot in front to help you balance. 2. Roll your hips slightly forward, so your hips are stacked directly over each other and your left / right knee is facing forward. 3. Tense the muscles in your inner thigh and lift your bottom leg 4-6 inches (10-15 cm). 4. Hold this position for __________ seconds. 5. Slowly return to the starting position. 6. Let your muscles relax completely between repetitions. Repeat __________ times. Complete this exercise __________ times a day. Straight leg raises, supine This exercise strengthens the muscles in the front of your thigh (quadriceps). 1. Lie on your back (supine position) with your left / right leg extended and your other knee bent. 2. Tense the muscles in the front of your left / right thigh. You should see your kneecap slide up or see increased dimpling just above your knee. 3. Keep these muscles tight as you raise your leg 4-6 inches (10-15 cm) off the floor. Do not let your knee bend. 4. Hold this position for __________ seconds. 5. Keep these muscles tense as you lower your leg. 6. Relax the muscles slowly and completely between repetitions. Repeat __________ times. Complete this exercise __________ times a day. Hip abductors, standing This exercise strengthens the muscles that move the leg and hip joint away from the center of the body (hip abductors). 1. Tie one end of a rubber exercise band or tubing to  a secure surface, such as a chair, table, or pole. 2. Loop the other end of the band or tubing around your left / right ankle. 3. Keeping your ankle with the band or tubing directly opposite the secured end, step away until there is tension in the tubing or band. Hold on to a chair, table, or pole as needed for balance. 4. Lift your left / right leg out to your side. While you do this: ?  Keep your back upright. ? Keep your shoulders over your hips. ? Keep your toes pointing forward. ? Make sure to use your hip muscles to slowly lift your leg. Do not tip your body or forcefully lift your leg. 5. Hold this position for __________ seconds. 6. Slowly return to the starting position. Repeat __________ times. Complete this exercise __________ times a day. Squats This exercise strengthens the muscles in the front of your thigh (quadriceps). 1. Stand in a door frame so your feet and knees are in line with the frame. You may place your hands on the frame for balance. 2. Slowly bend your knees and lower your hips like you are going to sit in a chair. ? Keep your lower legs in a straight-up-and-down position. ? Do not let your hips go lower than your knees. ? Do not bend your knees lower than told by your health care provider. ? If your hip pain increases, do not bend as low. 3. Hold this position for ___________ seconds. 4. Slowly push with your legs to return to standing. Do not use your hands to pull yourself to standing. Repeat __________ times. Complete this exercise __________ times a day. This information is not intended to replace advice given to you by your health care provider. Make sure you discuss any questions you have with your health care provider. Document Revised: 06/01/2019 Document Reviewed: 09/06/2018 Elsevier Patient Education  [place].

## 2020-09-03 NOTE — Progress Notes (Signed)
Patient notified

## 2020-09-28 IMAGING — MG DIGITAL SCREENING BILAT W/ TOMO W/ CAD
8 of 14 series · 8 of 40 positions shown · non-contrast
Comparison: Previous exam(s).

CLINICAL DATA: Screening.

EXAM:
DIGITAL SCREENING BILATERAL MAMMOGRAM WITH TOMO AND CAD

[R MLO synth-2D (1 of 2)]
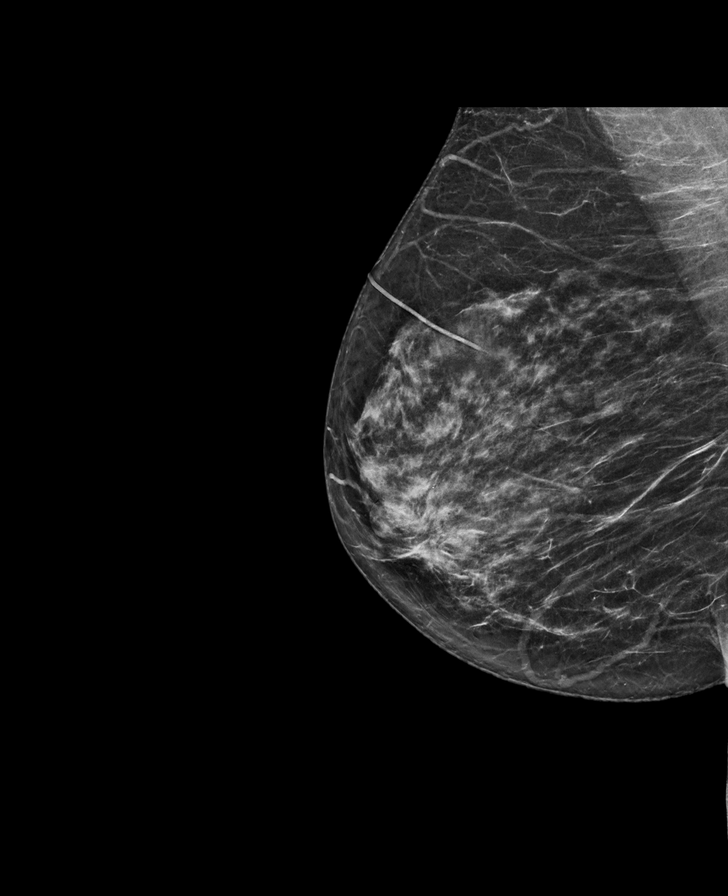

[L MLO synth-2D]
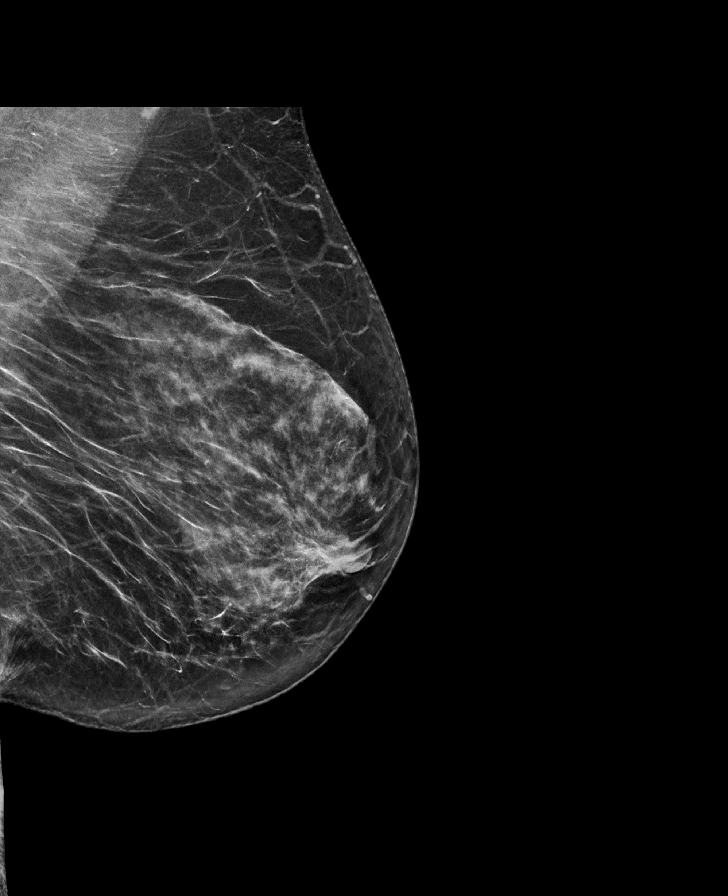

[R MLO synth-2D (2 of 2)]
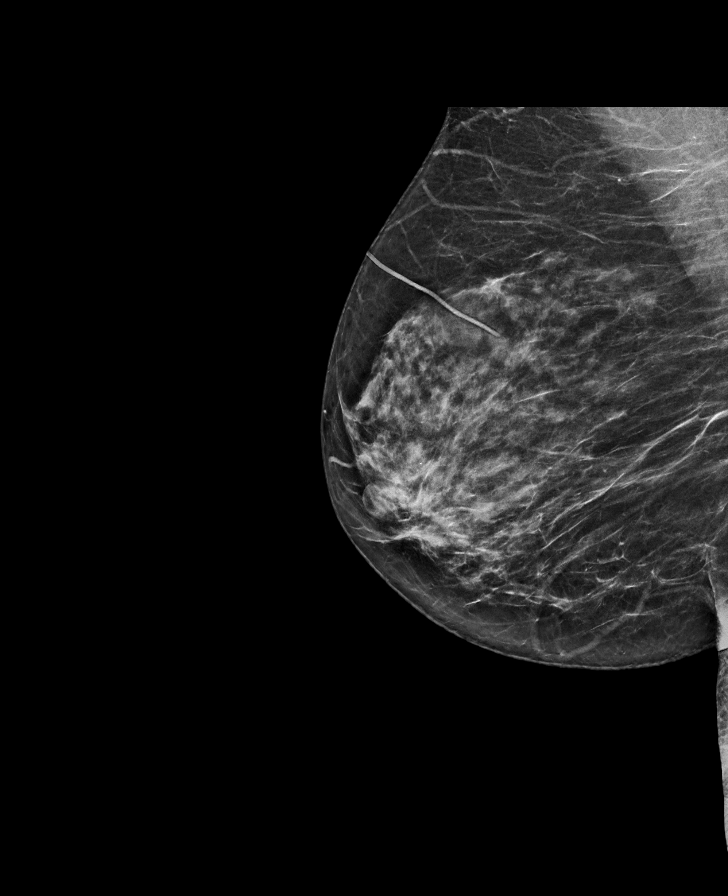

[R CC synth-2D (1 of 2)]
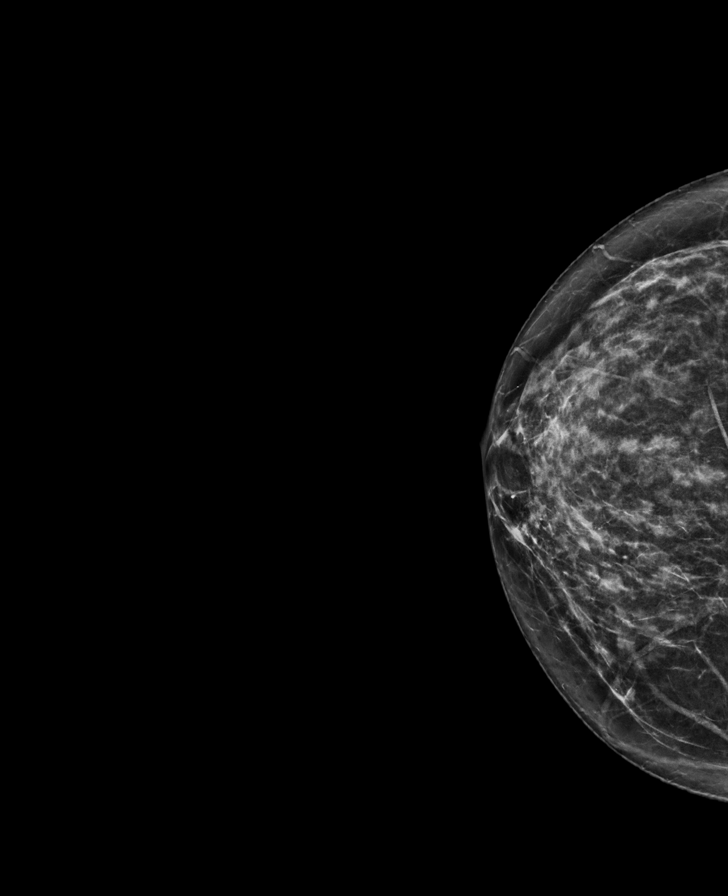

[R CC synth-2D (2 of 2)]
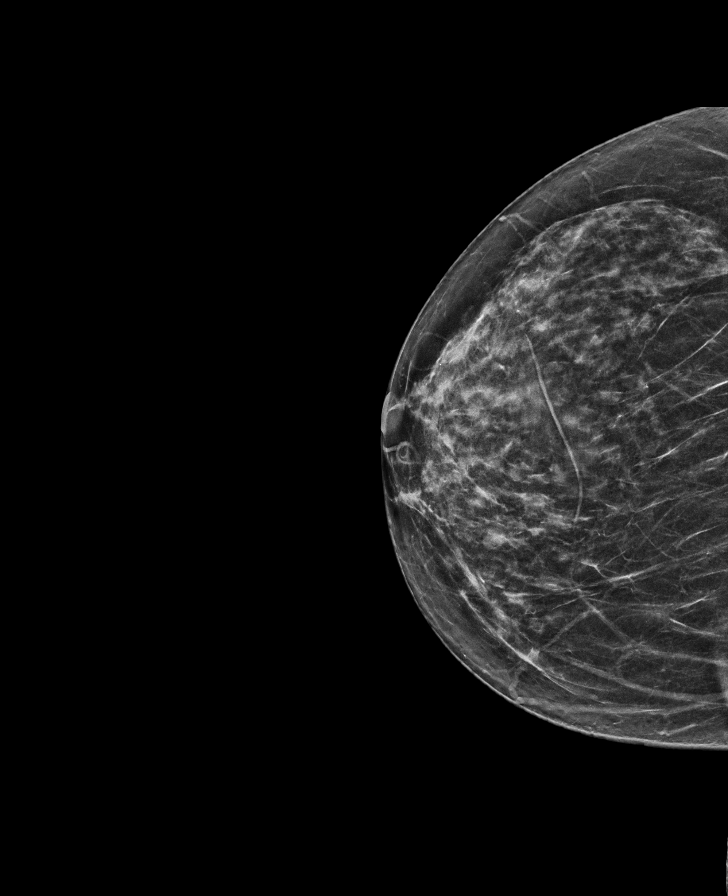

[L CC synth-2D (1 of 2)]
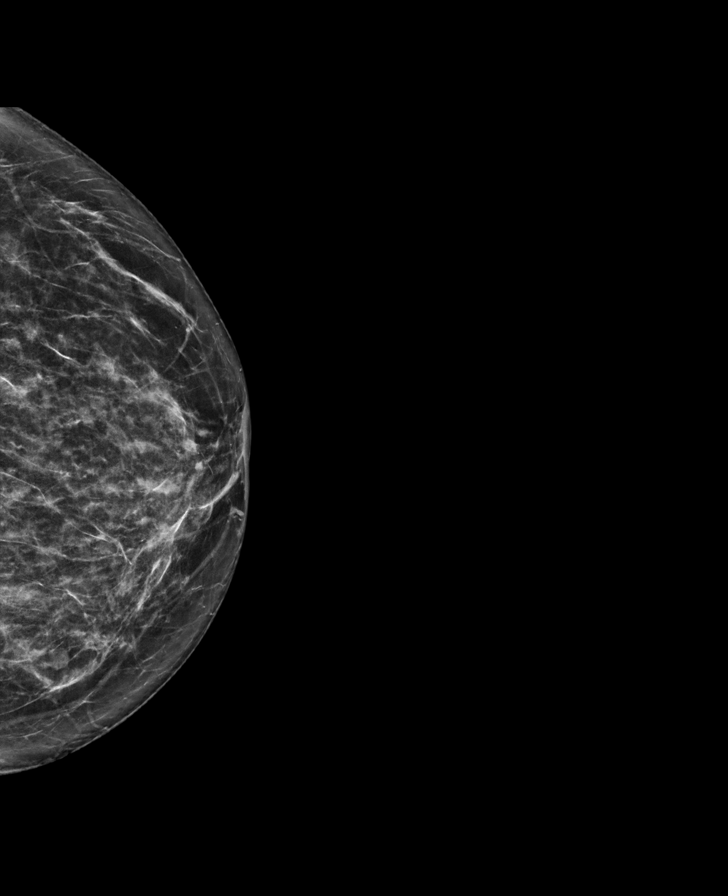

[L CC synth-2D (2 of 2)]
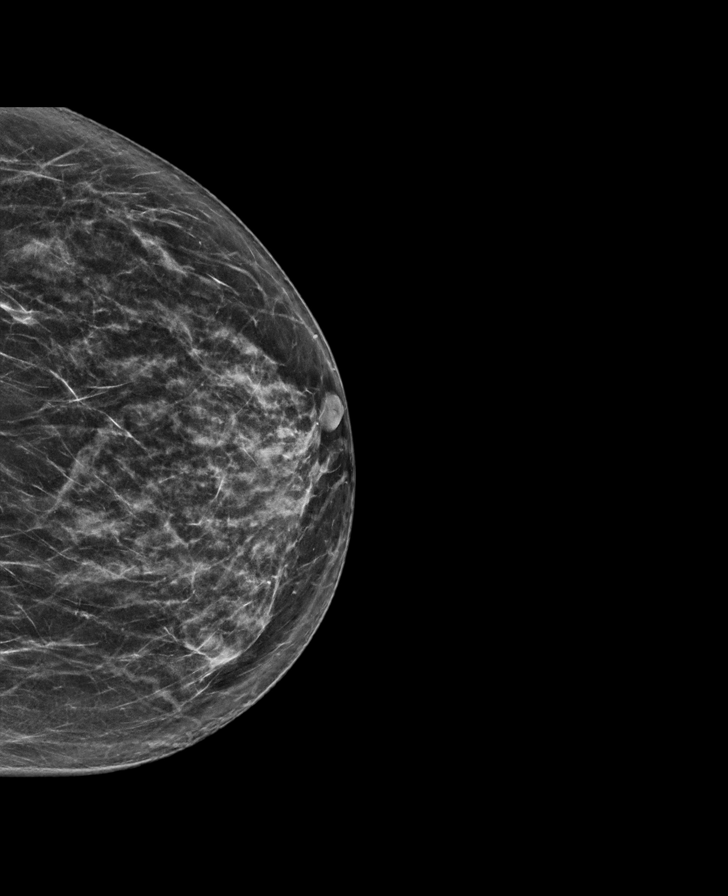

[L CC tomo · tomo slice 33/65.0]
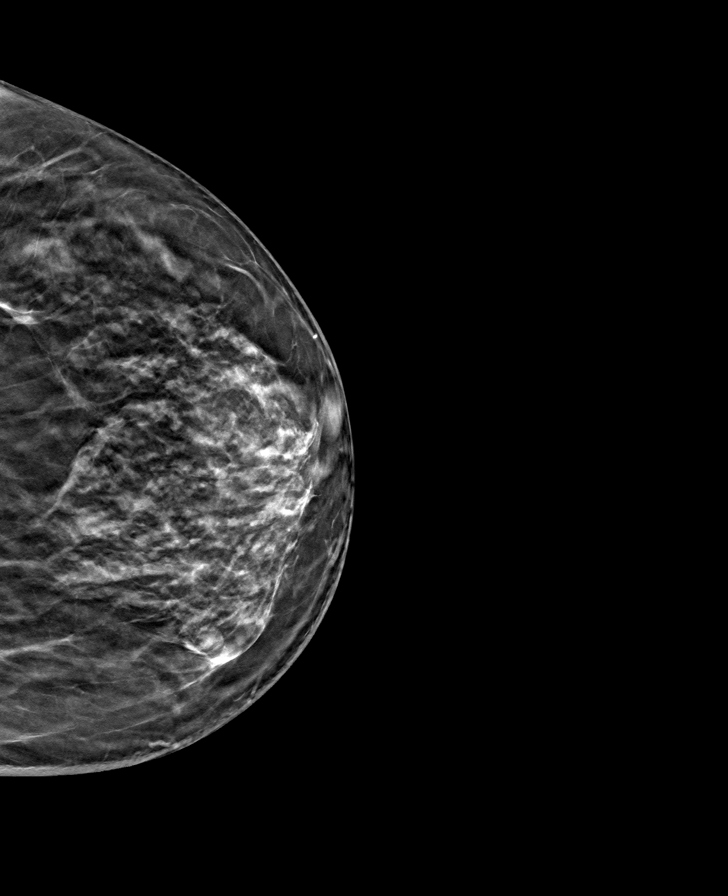

[8 of 40 positions shown; findings below may reference images not displayed]

ACR Breast Density Category c: The breast tissue is heterogeneously
dense, which may obscure small masses.
FINDINGS: There are no findings suspicious for malignancy. Images were
processed with CAD.
IMPRESSION: No mammographic evidence of malignancy. A result letter of this
screening mammogram will be mailed directly to the patient.

RECOMMENDATION:
Screening mammogram in one year. (Code:FT-[id number])

BI-RADS CATEGORY  1: Negative.

## 2020-10-15 ENCOUNTER — Ambulatory Visit (INDEPENDENT_AMBULATORY_CARE_PROVIDER_SITE_OTHER): Payer: Medicare HMO | Admitting: Pharmacist

## 2020-10-15 ENCOUNTER — Other Ambulatory Visit: Payer: Self-pay

## 2020-10-15 DIAGNOSIS — E8881 Metabolic syndrome: Secondary | ICD-10-CM | POA: Diagnosis not present

## 2020-10-15 NOTE — Progress Notes (Signed)
    10/15/2020 Name: Krista Gonzales MRN: [id number] DOB: Dec 24, 1950   S:  69 yoFInsurance coverage/medication affordability: [name] Presents for weight loss evaluation, education, and management Patient was referred and last seen by Primary Care Provider on 09/02/20.  She would like to lose weight and interested in [name].  She is currently 188lbs.     Patient reports adherence with medications.  Current medications for weight loss: n/a   Has tried OTC things in the past   Patient is active during the day.   She reports she has difficulty with snacking/carbs; she does not eat a lot of meat   O:  Current Weight 188lbs  Lipid Panel     Component Value Date/Time   CHOL 163 11/01/2019 1541   TRIG 41 11/01/2019 1541   TRIG 105 05/02/2014 1655   HDL 61 11/01/2019 1541   HDL 57 05/02/2014 1655   CHOLHDL 2.7 11/01/2019 1541   LDLCALC 93 11/01/2019 1541   LDLCALC 100 (H) 05/02/2014 1655    A/P:   -Healthy eating and meal planning discussed  -Will start Ozempic 0.25mg  sq weekly for 4 weeks, then increase to 0.5mg  sq weekly for 4 weeks (will titrate as needed).  Will obtain medication via [employer] patient assistance program.  Enrolled patient for 2022.  Medication to ship to PCP office (4 month supply).  Patient denies history of thyroid/medullary cancer.  Written patient instructions provided.  Total time in face to face counseling 25 minutes.   Regina Eck, PharmD, BCPS Clinical Pharmacist, [place]  [facility] Phone [phone]

## 2020-10-30 ENCOUNTER — Other Ambulatory Visit: Payer: Self-pay | Admitting: Family

## 2020-10-31 ENCOUNTER — Other Ambulatory Visit: Payer: Self-pay | Admitting: *Deleted

## 2020-10-31 DIAGNOSIS — I10 Essential (primary) hypertension: Secondary | ICD-10-CM

## 2020-10-31 MED ORDER — LISINOPRIL 40 MG PO TABS: 40.0000 mg | ORAL_TABLET | Freq: Every day | ORAL | 0 refills | Status: DC

## 2020-11-19 DIAGNOSIS — D3132 Benign neoplasm of left choroid: Secondary | ICD-10-CM | POA: Diagnosis not present

## 2020-11-19 DIAGNOSIS — Z135 Encounter for screening for eye and ear disorders: Secondary | ICD-10-CM | POA: Diagnosis not present

## 2020-11-19 DIAGNOSIS — Z0100 Encounter for examination of eyes and vision without abnormal findings: Secondary | ICD-10-CM | POA: Diagnosis not present

## 2020-11-19 DIAGNOSIS — H5200 Hypermetropia, unspecified eye: Secondary | ICD-10-CM | POA: Diagnosis not present

## 2020-12-18 ENCOUNTER — Telehealth: Payer: Self-pay | Admitting: *Deleted

## 2020-12-18 NOTE — Telephone Encounter (Signed)
Patient has received her ozempic and picked it up here at the office on  12/09/20.

## 2020-12-18 NOTE — Telephone Encounter (Signed)
LM for pt to call back   Need to know IF she has received her ozempic (pt assistance)   Aware to ask for Roselyn Reef or Abigail Butts.

## 2020-12-25 ENCOUNTER — Ambulatory Visit: Payer: Medicare HMO

## 2021-01-21 ENCOUNTER — Other Ambulatory Visit: Payer: Self-pay | Admitting: Family

## 2021-01-21 DIAGNOSIS — I10 Essential (primary) hypertension: Secondary | ICD-10-CM

## 2021-01-22 NOTE — Telephone Encounter (Signed)
[place] NT[employer] mail order not sent 

## 2021-03-07 ENCOUNTER — Other Ambulatory Visit: Payer: Self-pay

## 2021-03-07 ENCOUNTER — Encounter: Payer: Self-pay | Admitting: Family

## 2021-03-07 ENCOUNTER — Ambulatory Visit (INDEPENDENT_AMBULATORY_CARE_PROVIDER_SITE_OTHER): Payer: Medicare HMO | Admitting: Family

## 2021-03-07 VITALS — BP 111/76 | HR 78 | Temp 97.6°F | Ht 64.0 in | Wt 188.8 lb

## 2021-03-07 DIAGNOSIS — K5900 Constipation, unspecified: Secondary | ICD-10-CM

## 2021-03-07 DIAGNOSIS — I10 Essential (primary) hypertension: Secondary | ICD-10-CM

## 2021-03-07 DIAGNOSIS — F411 Generalized anxiety disorder: Secondary | ICD-10-CM | POA: Diagnosis not present

## 2021-03-07 DIAGNOSIS — E559 Vitamin D deficiency, unspecified: Secondary | ICD-10-CM

## 2021-03-07 DIAGNOSIS — E8881 Metabolic syndrome: Secondary | ICD-10-CM

## 2021-03-07 DIAGNOSIS — Z0000 Encounter for general adult medical examination without abnormal findings: Secondary | ICD-10-CM

## 2021-03-07 DIAGNOSIS — R0789 Other chest pain: Secondary | ICD-10-CM

## 2021-03-07 MED ORDER — FUROSEMIDE 20 MG PO TABS: 20.0000 mg | ORAL_TABLET | Freq: Every day | ORAL | 3 refills | Status: DC

## 2021-03-07 MED ORDER — LISINOPRIL 40 MG PO TABS: 40.0000 mg | ORAL_TABLET | Freq: Every day | ORAL | 2 refills | Status: DC

## 2021-03-07 MED ORDER — OZEMPIC (1 MG/DOSE) 4 MG/3ML ~~LOC~~ SOPN: 1.0000 mg | PEN_INJECTOR | SUBCUTANEOUS | 3 refills | Status: DC

## 2021-03-07 NOTE — Patient Instructions (Signed)
[email or web address]">  Generalized Anxiety Disorder, Adult Generalized anxiety disorder (GAD) is a mental health condition. Unlike normal worries, anxiety related to GAD is not triggered by a specific event. These worries do not fade or get better with time. GAD interferes with relationships, work, and school. GAD symptoms can vary from mild to severe. People with severe GAD can have intense waves of anxiety with physical symptoms that are similar to panic attacks. What are the causes? The exact cause of GAD is not known, but the following are believed to have an impact:  Differences in natural brain chemicals.  Genes passed down from parents to children.  Differences in the way threats are perceived.  Development during childhood.  Personality. What increases the risk? The following factors may make you more likely to develop this condition:  Being female.  Having a family history of anxiety disorders.  Being very shy.  Experiencing very stressful life events, such as the death of a loved one.  Having a very stressful family environment. What are the signs or symptoms? People with GAD often worry excessively about many things in their lives, such as their health and family. Symptoms may also include:  Mental and emotional symptoms: ? Worrying excessively about natural disasters. ? Fear of being late. ? Difficulty concentrating. ? Fears that others are judging your performance.  Physical symptoms: ? Fatigue. ? Headaches, muscle tension, muscle twitches, trembling, or feeling shaky. ? Feeling like your heart is pounding or beating very fast. ? Feeling out of breath or like you cannot take a deep breath. ? Having trouble falling asleep or staying asleep, or experiencing restlessness. ? Sweating. ? Nausea, diarrhea, or irritable bowel syndrome (IBS).  Behavioral symptoms: ? Experiencing erratic moods or irritability. ? Avoidance of new situations. ? Avoidance of  people. ? Extreme difficulty making decisions. How is this diagnosed? This condition is diagnosed based on your symptoms and medical history. You will also have a physical exam. Your health care provider may perform tests to rule out other possible causes of your symptoms. To be diagnosed with GAD, a person must have anxiety that:  Is out of his or her control.  Affects several different aspects of his or her life, such as work and relationships.  Causes distress that makes him or her unable to take part in normal activities.  Includes at least three symptoms of GAD, such as restlessness, fatigue, trouble concentrating, irritability, muscle tension, or sleep problems. Before your health care provider can confirm a diagnosis of GAD, these symptoms must be present more days than they are not, and they must last for 6 months or longer. How is this treated? This condition may be treated with:  Medicine. Antidepressant medicine is usually prescribed for long-term daily control. Anti-anxiety medicines may be added in severe cases, especially when panic attacks occur.  Talk therapy (psychotherapy). Certain types of talk therapy can be helpful in treating GAD by providing support, education, and guidance. Options include: ? Cognitive behavioral therapy (CBT). People learn coping skills and self-calming techniques to ease their physical symptoms. They learn to identify unrealistic thoughts and behaviors and to replace them with more appropriate thoughts and behaviors. ? Acceptance and commitment therapy (ACT). This treatment teaches people how to be mindful as a way to cope with unwanted thoughts and feelings. ? Biofeedback. This process trains you to manage your body's response (physiological response) through breathing techniques and relaxation methods. You will work with a therapist while machines are used to monitor your physical   symptoms.  Stress management techniques. These include yoga,  meditation, and exercise. A mental health specialist can help determine which treatment is best for you. Some people see improvement with one type of therapy. However, other people require a combination of therapies.   Follow these instructions at home: Lifestyle  Maintain a consistent routine and schedule.  Anticipate stressful situations. Create a plan, and allow extra time to work with your plan.  Practice stress management or self-calming techniques that you have learned from your therapist or your health care provider. General instructions  Take over-the-counter and prescription medicines only as told by your health care provider.  Understand that you are likely to have setbacks. Accept this and be kind to yourself as you persist to take better care of yourself.  Recognize and accept your accomplishments, even if you judge them as small.  Keep all follow-up visits as told by your health care provider. This is important. Contact a health care provider if:  Your symptoms do not get better.  Your symptoms get worse.  You have signs of depression, such as: ? A persistently sad or irritable mood. ? Loss of enjoyment in activities that used to bring you joy. ? Change in weight or eating. ? Changes in sleeping habits. ? Avoiding friends or family members. ? Loss of energy for normal tasks. ? Feelings of guilt or worthlessness. Get help right away if:  You have serious thoughts about hurting yourself or others. If you ever feel like you may hurt yourself or others, or have thoughts about taking your own life, get help right away. Go to your nearest emergency department or:  Call your local emergency services (911 in the [place]  Call a suicide crisis helpline, such as the National Suicide Prevention Lifeline at 1-800-273-8255. This is open 24 hours a day in the [place]  Text the Crisis Text Line at 741741 (in the [place] Summary  Generalized anxiety disorder (GAD) is a mental  health condition that involves worry that is not triggered by a specific event.  People with GAD often worry excessively about many things in their lives, such as their health and family.  GAD may cause symptoms such as restlessness, trouble concentrating, sleep problems, frequent sweating, nausea, diarrhea, headaches, and trembling or muscle twitching.  A mental health specialist can help determine which treatment is best for you. Some people see improvement with one type of therapy. However, other people require a combination of therapies. This information is not intended to replace advice given to you by your health care provider. Make sure you discuss any questions you have with your health care provider. Document Revised: 08/16/2019 Document Reviewed: 08/16/2019 Elsevier Patient Education  2021 Elsevier Inc.  

## 2021-03-07 NOTE — Progress Notes (Signed)
Subjective:    Patient ID: Krista Gonzales, female    DOB: 1951/05/27, 70 y.o.   MRN: [id number]  Chief Complaint  Patient presents with  . Annual Exam    Has had partial hysterectomy, been under some stress.   Pt presents to the office today for CPE. She reports she is having intermittent tightness of her chest and back. She reports she has had increased stress recently.    She is currently taking Ozempic weekly for metabolic syndrome and weight loss. She reports she has lost 5-7 lbs, but has stabilized.  Hypertension This is a chronic problem. The current episode started more than 1 year ago. The problem has been resolved since onset. The problem is controlled. Associated symptoms include anxiety. Pertinent negatives include no peripheral edema. Risk factors for coronary artery disease include dyslipidemia, obesity and sedentary lifestyle.  Constipation This is a chronic problem. The current episode started more than 1 year ago. The problem has been waxing and waning since onset. Risk factors include obesity. She has tried laxatives for the symptoms. The treatment provided moderate relief.  Anxiety Presents for initial visit. Onset was 1 to 4 weeks ago. The problem has been gradually worsening. Symptoms include depressed mood, excessive worry, irritability, nervous/anxious behavior and restlessness. Symptoms occur most days.   Past treatments include nothing.      Review of Systems  Constitutional: Positive for irritability.  Gastrointestinal: Positive for constipation.  Psychiatric/Behavioral: The patient is nervous/anxious.   All other systems reviewed and are negative.  No family history on file. Social History   Socioeconomic History  . Marital status: Married    Spouse name: Krista Gonzales  . Number of children: 2  . Years of education: 23  . Highest education level: 12th grade  Occupational History  . Occupation: Retired  Tobacco Use  . Smoking status: Never Smoker  .  Smokeless tobacco: Never Used  Substance and Sexual Activity  . Alcohol use: No  . Drug use: No  . Sexual activity: Not Currently  Other Topics Concern  . Not on file  Social History Narrative  . Not on file   Social Determinants of Health   Financial Resource Strain: Not on file  Food Insecurity: Not on file  Transportation Needs: Not on file  Physical Activity: Not on file  Stress: Not on file  Social Connections: Not on file       Objective:   Physical Exam Vitals reviewed.  Constitutional:      General: She is not in acute distress.    Appearance: She is well-developed.  HENT:     Head: Normocephalic and atraumatic.     Right Ear: Tympanic membrane normal.     Left Ear: Tympanic membrane normal.  Eyes:     Pupils: Pupils are equal, round, and reactive to light.  Neck:     Thyroid: No thyromegaly.  Cardiovascular:     Rate and Rhythm: Normal rate and regular rhythm.     Heart sounds: Normal heart sounds. No murmur heard.   Pulmonary:     Effort: Pulmonary effort is normal. No respiratory distress.     Breath sounds: Normal breath sounds. No wheezing.  Abdominal:     General: Bowel sounds are normal. There is no distension.     Palpations: Abdomen is soft.     Tenderness: There is no abdominal tenderness.  Musculoskeletal:        General: No tenderness. Normal range of motion.  Cervical back: Normal range of motion and neck supple.  Skin:    General: Skin is warm and dry.  Neurological:     Mental Status: She is alert and oriented to person, place, and time.     Cranial Nerves: No cranial nerve deficit.     Deep Tendon Reflexes: Reflexes are normal and symmetric.  Psychiatric:        Behavior: Behavior normal.        Thought Content: Thought content normal.        Judgment: Judgment normal.       BP 111/76   Pulse 78   Temp 97.6 F (36.4 C) (Temporal)   Ht _0  (1.626 m)   Wt 188 lb 12.8 oz (85.6 kg)   SpO2 98%   BMI 32.41 kg/m       Assessment & Plan:  Krista Gonzales Krista Gonzales comes in today with chief complaint of Annual Exam (Has had partial hysterectomy, been under some stress.)   Diagnosis and orders addressed:  1. Essential hypertension - lisinopril (ZESTRIL) 40 MG tablet; Take 1 tablet (40 mg total) by mouth daily.  Dispense: 90 tablet; Refill: 2 - CMP14+EGFR - CBC with Differential/Platelet  2. Tightness in chest - EKG 12-Lead - CMP14+EGFR - CBC with Differential/Platelet  3. Annual physical exam - CMP14+EGFR - CBC with Differential/Platelet - Lipid panel - TSH - VITAMIN D 25 Hydroxy (Vit-D Deficiency, Fractures)  4. Constipation, unspecified constipation type - CMP14+EGFR - CBC with Differential/Platelet  5. Primary hypertension - CMP14+EGFR - CBC with Differential/Platelet  6. Metabolic syndrome - FUX32+TFTD - CBC with Differential/Platelet  7. Vitamin D deficiency - CMP14+EGFR - CBC with Differential/Platelet - VITAMIN D 25 Hydroxy (Vit-D Deficiency, Fractures)   8. GAD (generalized anxiety disorder) Pt wants to hold on medications at this time    Labs pending Health Maintenance reviewed Diet and exercise encouraged  Follow up plan: 6 months   Evelina Dun, FNP

## 2021-03-08 LAB — CMP14+EGFR
ALT: 16 IU/L (ref 0–32)
AST: 18 IU/L (ref 0–40)
Albumin/Globulin Ratio: 2.3 — ABNORMAL HIGH (ref 1.2–2.2)
Albumin: 4.6 g/dL (ref 3.8–4.8)
Alkaline Phosphatase: 86 IU/L (ref 44–121)
BUN/Creatinine Ratio: 30 — ABNORMAL HIGH (ref 12–28)
BUN: 25 mg/dL (ref 8–27)
Bilirubin Total: 0.6 mg/dL (ref 0.0–1.2)
CO2: 28 mmol/L (ref 20–29)
Calcium: 9.3 mg/dL (ref 8.7–10.3)
Chloride: 102 mmol/L (ref 96–106)
Creatinine, Ser: 0.82 mg/dL (ref 0.57–1.00)
Globulin, Total: 2 g/dL (ref 1.5–4.5)
Glucose: 89 mg/dL (ref 65–99)
Potassium: 3.5 mmol/L (ref 3.5–5.2)
Sodium: 143 mmol/L (ref 134–144)
Total Protein: 6.6 g/dL (ref 6.0–8.5)
eGFR: 78 mL/min/{1.73_m2} (ref >59)

## 2021-03-08 LAB — CBC WITH DIFFERENTIAL/PLATELET
Basophils Absolute: 0 10*3/uL (ref 0.0–0.2)
Basos: 1 %
EOS (ABSOLUTE): 0.3 10*3/uL (ref 0.0–0.4)
Eos: 4 %
Hematocrit: 41.7 % (ref 34.0–46.6)
Hemoglobin: 13.7 g/dL (ref 11.1–15.9)
Immature Grans (Abs): 0 10*3/uL (ref 0.0–0.1)
Immature Granulocytes: 0 %
Lymphocytes Absolute: 1.6 10*3/uL (ref 0.7–3.1)
Lymphs: 25 %
MCH: 29.7 pg (ref 26.6–33.0)
MCHC: 32.9 g/dL (ref 31.5–35.7)
MCV: 91 fL (ref 79–97)
Monocytes Absolute: 0.5 10*3/uL (ref 0.1–0.9)
Monocytes: 8 %
Neutrophils Absolute: 4.1 10*3/uL (ref 1.4–7.0)
Neutrophils: 62 %
Platelets: 260 10*3/uL (ref 150–450)
RBC: 4.61 x10E6/uL (ref 3.77–5.28)
RDW: 13.2 % (ref 11.7–15.4)
WBC: 6.6 10*3/uL (ref 3.4–10.8)

## 2021-03-08 LAB — VITAMIN D 25 HYDROXY (VIT D DEFICIENCY, FRACTURES): Vit D, 25-Hydroxy: 34.2 ng/mL (ref 30.0–100.0)

## 2021-03-08 LAB — LIPID PANEL
Chol/HDL Ratio: 3.5 ratio (ref 0.0–4.4)
Cholesterol, Total: 189 mg/dL (ref 100–199)
HDL: 54 mg/dL (ref >39)
LDL Chol Calc (NIH): 111 mg/dL — ABNORMAL HIGH (ref 0–99)
Triglycerides: 139 mg/dL (ref 0–149)
VLDL Cholesterol Cal: 24 mg/dL (ref 5–40)

## 2021-03-08 LAB — TSH: TSH: 1.2 u[IU]/mL (ref 0.450–4.500)

## 2021-03-11 ENCOUNTER — Telehealth: Payer: Self-pay | Admitting: Pharmacist

## 2021-03-11 NOTE — Telephone Encounter (Signed)
Refill request sent for Ozempic 1mg  (increased dose) Faxed to [facility] Expect shipment in 2-3 weeks to PCP office

## 2021-03-13 ENCOUNTER — Other Ambulatory Visit: Payer: Self-pay | Admitting: Family Medicine

## 2021-03-13 DIAGNOSIS — Z1231 Encounter for screening mammogram for malignant neoplasm of breast: Secondary | ICD-10-CM

## 2021-04-04 ENCOUNTER — Telehealth: Payer: Self-pay | Admitting: *Deleted

## 2021-04-04 NOTE — Telephone Encounter (Signed)
Pt called and aware [facility] Ozempic 1 mg  - #4 boxes came in today and they are in fridge ready for pick up.

## 2021-05-02 ENCOUNTER — Ambulatory Visit (INDEPENDENT_AMBULATORY_CARE_PROVIDER_SITE_OTHER): Payer: Medicare HMO | Admitting: Family Medicine

## 2021-05-02 ENCOUNTER — Other Ambulatory Visit: Payer: Self-pay

## 2021-05-02 ENCOUNTER — Encounter: Payer: Self-pay | Admitting: Family Medicine

## 2021-05-02 ENCOUNTER — Ambulatory Visit (INDEPENDENT_AMBULATORY_CARE_PROVIDER_SITE_OTHER): Payer: Medicare HMO

## 2021-05-02 VITALS — BP 131/81 | HR 80 | Temp 98.0°F | Ht 64.0 in | Wt 192.2 lb

## 2021-05-02 DIAGNOSIS — R1084 Generalized abdominal pain: Secondary | ICD-10-CM

## 2021-05-02 DIAGNOSIS — R109 Unspecified abdominal pain: Secondary | ICD-10-CM | POA: Diagnosis not present

## 2021-05-02 NOTE — Progress Notes (Signed)
Subjective:  Patient ID: Krista Gonzales, female    DOB: 1951/09/20  Age: 70 y.o. MRN: [id number]  CC: No chief complaint on file.   HPI Krista Gonzales presents for everything I eat is getting stuck. Stomach feels hard. Pushes on rib cage. Onset 3-4 months ago. INcreased in the last 2-3 weeks.  Using Ozempic for six months. Dose was increased 2-3 weeks ago. She can't eat much. Just a few bites. Concerned that something is going on from her previous hernia surgery.   Depression screen [facility] 2/9 05/02/2021 05/02/2021 03/07/2021  Decreased Interest 0 0 0  Down, Depressed, Hopeless 0 0 0  PHQ - 2 Score 0 0 0  Altered sleeping 0 - -  Tired, decreased energy 3 - -  Change in appetite 2 - -  Trouble concentrating 0 - -  Moving slowly or fidgety/restless 0 - -  Suicidal thoughts 0 - -  PHQ-9 Score 5 - -  Difficult doing work/chores Not difficult at all - -    History Krista Gonzales has a past medical history of Hypertension.   She has a past surgical history that includes Gallbladder surgery; Tumor removal; Breast excisional biopsy (Right); Abdominal hysterectomy; and Hernia repair.   Her family history is not on file.She reports that she has never smoked. She has never used smokeless tobacco. She reports that she does not drink alcohol and does not use drugs.    ROS Review of Systems  Constitutional: Negative.   HENT: Negative.    Eyes:  Negative for visual disturbance.  Respiratory:  Negative for shortness of breath.   Cardiovascular:  Negative for chest pain.  Gastrointestinal:  Positive for abdominal distention, abdominal pain and nausea. Negative for diarrhea and vomiting.  Musculoskeletal:  Negative for arthralgias.   Objective:  BP 131/81   Pulse 80   Temp 98 F (36.7 C)   Ht 5\' 4"  (1.626 m)   Wt 192 lb 3.2 oz (87.2 kg)   SpO2 96%   BMI 32.99 kg/m   BP Readings from Last 3 Encounters:  05/02/21 131/81  03/07/21 111/76  09/02/20 116/73    Wt Readings from Last 3  Encounters:  05/02/21 192 lb 3.2 oz (87.2 kg)  03/07/21 188 lb 12.8 oz (85.6 kg)  09/02/20 188 lb 12.8 oz (85.6 kg)     Physical Exam Constitutional:      General: She is not in acute distress.    Appearance: She is well-developed.  HENT:     Head: Normocephalic and atraumatic.  Eyes:     Conjunctiva/sclera: Conjunctivae normal.  Neck:     Thyroid: No thyromegaly.  Cardiovascular:     Rate and Rhythm: Normal rate and regular rhythm.     Heart sounds: Normal heart sounds. No murmur heard. Pulmonary:     Effort: Pulmonary effort is normal. No respiratory distress.     Breath sounds: Normal breath sounds. No wheezing or rales.  Abdominal:     General: Bowel sounds are normal. There is no distension.     Palpations: Abdomen is soft.     Tenderness: There is no abdominal tenderness.  Musculoskeletal:        General: Normal range of motion.     Cervical back: Normal range of motion and neck supple.  Skin:    General: Skin is warm and dry.  Neurological:     Mental Status: She is alert and oriented to person, place, and time.  Psychiatric:  Behavior: Behavior normal.        Thought Content: Thought content normal.        Judgment: Judgment normal.    XR, abd: no sign of obstruction, nml bowel gas, no masses. Marland Kitchenwdsx  Assessment & Plan:   Diagnoses and all orders for this visit:  Generalized abdominal pain -     DG Abd 2 Views; Future      I am having [place] maintain her multivitamin with minerals, Vitamin D (Ergocalciferol), polyethylene glycol, lisinopril, furosemide, and Ozempic (1 MG/DOSE).  Allergies as of 05/02/2021   Not on File      Medication List        Accurate as of May 02, 2021  3:57 PM. If you have any questions, ask your nurse or doctor.          furosemide 20 MG tablet Commonly known as: LASIX Take 1 tablet (20 mg total) by mouth daily.   lisinopril 40 MG tablet Commonly known as: ZESTRIL Take 1 tablet (40 mg total) by  mouth daily.   multivitamin with minerals tablet Take 1 tablet by mouth daily.   Ozempic (1 MG/DOSE) 4 MG/3ML Sopn Generic drug: Semaglutide (1 MG/DOSE) Inject 1 mg into the skin once a week.   polyethylene glycol 17 g packet Commonly known as: MIRALAX / GLYCOLAX Take 17 g by mouth daily.   Vitamin D (Ergocalciferol) 1.25 MG (50000 UNIT) Caps capsule Commonly known as: DRISDOL Take 1 capsule (50,000 Units total) by mouth every 7 (seven) days.         Follow-up: No follow-ups on file.  Claretta Fraise, M.D.

## 2021-05-06 ENCOUNTER — Other Ambulatory Visit: Payer: Self-pay

## 2021-05-06 ENCOUNTER — Ambulatory Visit
Admission: RE | Admit: 2021-05-06 | Discharge: 2021-05-06 | Disposition: A | Discharge status: Home / Self Care | Payer: Medicare HMO | Source: Ambulatory Visit | Attending: Family Medicine | Admitting: Family Medicine

## 2021-05-06 DIAGNOSIS — Z1231 Encounter for screening mammogram for malignant neoplasm of breast: Secondary | ICD-10-CM

## 2021-05-09 DIAGNOSIS — K219 Gastro-esophageal reflux disease without esophagitis: Secondary | ICD-10-CM | POA: Diagnosis not present

## 2021-05-09 DIAGNOSIS — R112 Nausea with vomiting, unspecified: Secondary | ICD-10-CM | POA: Diagnosis not present

## 2021-05-09 DIAGNOSIS — K5900 Constipation, unspecified: Secondary | ICD-10-CM | POA: Diagnosis not present

## 2021-07-30 ENCOUNTER — Ambulatory Visit (INDEPENDENT_AMBULATORY_CARE_PROVIDER_SITE_OTHER): Payer: Medicare HMO | Admitting: Pharmacist

## 2021-07-30 DIAGNOSIS — E8881 Metabolic syndrome: Secondary | ICD-10-CM

## 2021-07-30 DIAGNOSIS — E782 Mixed hyperlipidemia: Secondary | ICD-10-CM

## 2021-07-30 NOTE — Progress Notes (Signed)
Chronic Care Management Pharmacy Note  07/30/2021 Name:  Krista Gonzales MRN:  [id number] DOB:  12-29-1950  Summary: metabolic syndrome/obesity  Recommendations/Changes made from today's visit: Overweight/Obesity Complicated by hyperlipidemia: Unable to achieve goal weight loss through lifestyle modification alone; current treatment; n/a  Medications/Strategies previously tried: ozempic She would like to restart--had great results, however began to have GI issues.  She now thinks GI issues were unrelated to Ozempic Denies personal and family history of Medullary thyroid cancer (MTC) Will restart Ozempic 0.90m sq weekly for 4 weeks, then increase to 0.570msq weekly for 4 weeks, then increase to 72m82mq weekly for 4 weeks.  Will titrate as tolerated Baseline weight: 192; most recent weight: 192 Current meal patterns: has difficulty snacking/carbs Current exercise: active/works on projects outside, etc Extensive dietary counseling including education on focus on lean proteins, fruits and vegetables, whole grains and increased fiber consumption, adequate hydration Extensive exercise counseling including eventual goal of 150 minutes of moderate intensity exercise weekly Recommended restart ozempic Assessed patient finances. Will refill ozempic supply--patient already enrolled in the [employer] patient assistance program for 2022  Follow Up Plan: Telephone follow up appointment with care management team member scheduled for: 6 weeks  Subjective: Krista Gonzales an [age]o. year old female who is a primary patient of HawSharion BalloonNP.  The CCM team was consulted for assistance with disease management and care coordination needs.    Engaged with patient by telephone for initial visit in response to provider referral for pharmacy case management and/or care coordination services.   Consent to Services:  The patient was given the following information about Chronic Care  Management services today, agreed to services, and gave verbal consent: 1. CCM service includes personalized support from designated clinical staff supervised by the primary care provider, including individualized plan of care and coordination with other care providers 2. 24/7 contact phone numbers for assistance for urgent and routine care needs. 3. Service will only be billed when office clinical staff spend 20 minutes or more in a month to coordinate care. 4. Only one practitioner may furnish and bill the service in a calendar month. 5.The patient may stop CCM services at any time (effective at the end of the month) by phone call to the office staff. 6. The patient will be responsible for cost sharing (co-pay) of up to 20% of the service fee (after annual deductible is met). Patient agreed to services and consent obtained.  Patient Care Team: HawSharion BalloonNP as PCP - General (Family Medicine) [facility] (Family [facility])  Objective:  Lab Results  Component Value Date   CREATININE 0.82 03/07/2021   CREATININE 0.78 05/14/2020   CREATININE 0.80 02/14/2020    No results found for: HGBA1C Last diabetic Eye exam: No results found for: HMDIABEYEEXA  Last diabetic Foot exam: No results found for: HMDIABFOOTEX      Component Value Date/Time   CHOL 189 03/07/2021 1507   TRIG 139 03/07/2021 1507   TRIG 105 05/02/2014 1655   HDL 54 03/07/2021 1507   HDL 57 05/02/2014 1655   CHOLHDL 3.5 03/07/2021 1507   LDLCALC 111 (H) 03/07/2021 1507   LDLCALC 100 (H) 05/02/2014 1655    Hepatic Function Latest Ref Rng & Units 03/07/2021 05/14/2020 02/14/2020  Total Protein 6.0 - 8.5 g/dL 6.6 5.7(L) 6.8  Albumin 3.8 - 4.8 g/dL 4.6 3.7(L) 4.6  AST 0 - 40 IU/L _0 ALT  0 - 32 IU/L _0 Alk Phosphatase 44 - 121 IU/L 86 62 86  Total Bilirubin 0.0 - 1.2 mg/dL 0.6 0.2 0.6    Lab Results  Component Value Date/Time   TSH 1.200 03/07/2021 03:07 PM   TSH 0.898 11/01/2019  03:41 PM    CBC Latest Ref Rng & Units 03/07/2021 05/14/2020 11/01/2019  WBC 3.4 - 10.8 x10E3/uL 6.6 5.4 5.4  Hemoglobin 11.1 - 15.9 g/dL 13.7 12.7 12.6  Hematocrit 34.0 - 46.6 % 41.7 39.1 38.3  Platelets 150 - 450 x10E3/uL 260 265 251    Lab Results  Component Value Date/Time   VD25OH 34.2 03/07/2021 03:07 PM   VD25OH 40.7 02/14/2020 03:46 PM    Clinical ASCVD: No  The 10-year ASCVD risk score (Arnett DK, et al., 2019) is: 10.7%   Values used to calculate the score:     Age: 70 years     Sex: Female     Is Non-Hispanic African American: No     Diabetic: No     Tobacco smoker: No     Systolic Blood Pressure: 712 mmHg     Is BP treated: Yes     HDL Cholesterol: 54 mg/dL     Total Cholesterol: 189 mg/dL    Other: (CHADS2VASc if Afib, PHQ9 if depression, MMRC or CAT for COPD, ACT, DEXA)  Social History   Tobacco Use  Smoking Status Never  Smokeless Tobacco Never   BP Readings from Last 3 Encounters:  05/02/21 131/81  03/07/21 111/76  09/02/20 116/73   Pulse Readings from Last 3 Encounters:  05/02/21 80  03/07/21 78  09/02/20 73   Wt Readings from Last 3 Encounters:  05/02/21 192 lb 3.2 oz (87.2 kg)  03/07/21 188 lb 12.8 oz (85.6 kg)  09/02/20 188 lb 12.8 oz (85.6 kg)    Assessment: Review of patient past medical history, allergies, medications, health status, including review of consultants reports, laboratory and other test data, was performed as part of comprehensive evaluation and provision of chronic care management services.   SDOH:  (Social Determinants of Health) assessments and interventions performed:    [place]  Not on File  Medications Reviewed Today     Reviewed by Claretta Fraise, MD (Physician) on 05/02/21 at 1602  Med List Status: <None>   Medication Order Taking? Sig Documenting Provider Last Dose Status Informant  furosemide (LASIX) 20 MG tablet [id number] Yes Take 1 tablet (20 mg total) by mouth daily. Sharion Balloon, FNP Taking  Active   lisinopril (ZESTRIL) 40 MG tablet [id number] Yes Take 1 tablet (40 mg total) by mouth daily. Sharion Balloon, FNP Taking Active   Multiple Vitamins-Minerals (MULTIVITAMIN WITH MINERALS) tablet [id number] Yes Take 1 tablet by mouth daily.  [provider] Taking Active   polyethylene glycol (MIRALAX / GLYCOLAX) 17 g packet [id number] Yes Take 17 g by mouth daily. Sharion Balloon, FNP Taking Active   Semaglutide, 1 MG/DOSE, (OZEMPIC, 1 MG/DOSE,) 4 MG/3ML SOPN [id number] Yes Inject 1 mg into the skin once a week. Sharion Balloon, FNP Taking Active   Vitamin D, Ergocalciferol, (DRISDOL) 1.25 MG (50000 UT) CAPS capsule [id number] Yes Take 1 capsule (50,000 Units total) by mouth every 7 (seven) days. [name], FNP Taking Active             Patient Active Problem List   Diagnosis Date Noted   GAD (generalized anxiety disorder) 89/21/1941   Metabolic syndrome 74/06/1447   Osteoporosis  03/05/2020   OAB (overactive bladder) 02/14/2020   Vitamin D deficiency 06/08/2019   B12 deficiency 06/08/2019   Peripheral edema 11/19/2015   Obesity (BMI 30.0-34.9) 11/19/2015   Hypertension     Immunization History  Administered Date(s) Administered   Fluad Quad(high Dose 65+) 11/01/2019, 09/02/2020   Influenza,inj,Quad PF,6+ Mos 11/19/2015, 09/07/2018   Moderna SARS-COV2 Booster Vaccination 11/10/2020   PFIZER(Purple Top)SARS-COV-2 Vaccination 01/10/2020, 01/31/2020   Pneumococcal Conjugate-13 06/08/2019   Pneumococcal Polysaccharide-23 09/02/2020   Tdap 08/22/2013, 06/26/2020    Conditions to be addressed/monitored: Metabolic syndrome, hyperlipidemia  Care Plan : PHARMD MEDICATION MANAGEMENT  Updates made by Lavera Guise, [place] since 08/05/2021 12:00 AM     Problem: DISEASE PROGRESSION PREVENTION      Long-Range Goal: Metabolic Syndrome/Obesity   This Visit's Progress: Not on track  Priority: High  Note:   Current Barriers:  Unable to independently afford treatment  regimen Unable to achieve control of metabolic syndrome   Pharmacist Clinical Goal(s):  Over the next 90 days, patient will verbalize ability to afford treatment regimen achieve control of metabolic syndrome as evidenced by decreased weight, improved lipid panel, improved quality of life  through collaboration with PharmD and provider.   Interventions: 1:1 collaboration with Sharion Balloon, FNP regarding development and update of comprehensive plan of care as evidenced by provider attestation and co-signature Inter-disciplinary care team collaboration (see longitudinal plan of care) Comprehensive medication review performed; medication list updated in electronic medical record  Overweight/Obesity Complicated by hyperlipidemia: Unable to achieve goal weight loss through lifestyle modification alone; current treatment; n/a  Medications/Strategies previously tried: ozempic She would like to restart--had great results, however began to have GI issues.  She now thinks GI issues were unrelated to Ozempic Denies personal and family history of Medullary thyroid cancer (MTC) Will restart Ozempic 0.33m sq weekly for 4 weeks, then increase to 0.552msq weekly for 4 weeks, then increase to 48m75mq weekly for 4 weeks.  Will titrate as tolerated Baseline weight: 192; most recent weight: 192 Current meal patterns: has difficulty snacking/carbs Current exercise: active/works on projects outside, etc Extensive dietary counseling including education on focus on lean proteins, fruits and vegetables, whole grains and increased fiber consumption, adequate hydration Extensive exercise counseling including eventual goal of 150 minutes of moderate intensity exercise weekly Recommended restart ozempic Assessed patient finances. Will refill ozempic supply--patient already enrolled in the [employer] patient assistance program for 2022   Patient Goals/Self-Care Activities Over the next 90 days, patient will:  -  take medications as prescribed collaborate with provider on medication access solutions target a minimum of 150 minutes of moderate intensity exercise weekly engage in dietary modifications by following healthy plate method   Follow Up Plan: Telephone follow up appointment with care management team member scheduled for: 6 weeks      Medication Assistance:  ozempic obtained through [employer]  medication assistance program.  Enrollment ends 11/08/21  Patient's preferred pharmacy is:  [facility] [id number]: [phone]: [phone] Up:  Patient agrees to Care Plan and Follow-up.  Plan: Telephone follow up appointment with care management team member scheduled for:  6 weeks    JulRegina EckharmD, BCPS Clinical Pharmacist, [facility] Phone [phone]

## 2021-08-05 NOTE — Patient Instructions (Signed)
Visit Information  PATIENT GOALS:  Goals Addressed               This Visit's Progress     Patient Stated     Metabolic Syndrome/Obesity (pt-stated)        Current Barriers:  Unable to independently afford treatment regimen Unable to achieve control of metabolic syndrome   Pharmacist Clinical Goal(s):  Over the next 90 days, patient will verbalize ability to afford treatment regimen achieve control of metabolic syndrome as evidenced by decreased weight, improved lipid panel, improved quality of life through collaboration with PharmD and provider.   Interventions: 1:1 collaboration with Sharion Balloon, FNP regarding development and update of comprehensive plan of care as evidenced by provider attestation and co-signature Inter-disciplinary care team collaboration (see longitudinal plan of care) Comprehensive medication review performed; medication list updated in electronic medical record  Overweight/Obesity Complicated by hyperlipidemia: Unable to achieve goal weight loss through lifestyle modification alone; current treatment; n/a  Medications/Strategies previously tried: ozempic She would like to restart--had great results, however began to have GI issues.  She now thinks GI issues were unrelated to Ozempic Denies personal and family history of Medullary thyroid cancer (MTC) Will restart Ozempic 0.25mg  sq weekly for 4 weeks, then increase to 0.5mg  sq weekly for 4 weeks, then increase to 1mg  sq weekly for 4 weeks.  Will titrate as tolerated Baseline weight: 192; most recent weight: 192 Current meal patterns: has difficulty snacking/carbs Current exercise: active/works on projects outside, etc Extensive dietary counseling including education on focus on lean proteins, fruits and vegetables, whole grains and increased fiber consumption, adequate hydration Extensive exercise counseling including eventual goal of 150 minutes of moderate intensity exercise weekly Recommended  restart ozempic Assessed patient finances. Will refill ozempic supply--patient already enrolled in the [employer] patient assistance program for 2022   Patient Goals/Self-Care Activities Over the next 90 days, patient will:  - take medications as prescribed collaborate with provider on medication access solutions target a minimum of 150 minutes of moderate intensity exercise weekly engage in dietary modifications by following healthy plate method   Follow Up Plan: Telephone follow up appointment with care management team member scheduled for: 6 weeks         The patient verbalized understanding of instructions, educational materials, and care plan provided today and declined offer to receive copy of patient instructions, educational materials, and care plan.   Telephone follow up appointment with care management team member scheduled for: 6 weeks  Signature Regina Eck, PharmD, BCPS Clinical Pharmacist, [place]  [facility] Phone [phone]

## 2021-08-08 DIAGNOSIS — E663 Overweight: Secondary | ICD-10-CM | POA: Diagnosis not present

## 2021-08-08 DIAGNOSIS — E782 Mixed hyperlipidemia: Secondary | ICD-10-CM | POA: Diagnosis not present

## 2021-08-25 ENCOUNTER — Telehealth: Payer: Self-pay | Admitting: Family

## 2021-08-25 NOTE — Telephone Encounter (Signed)
Left message for patient to call back and schedule Medicare Annual Wellness Visit    Last [facility] 02/20/20 ; please schedule at anytime with health coach  This should be a 45 minute visit.

## 2021-08-29 ENCOUNTER — Ambulatory Visit (INDEPENDENT_AMBULATORY_CARE_PROVIDER_SITE_OTHER): Payer: Medicare HMO

## 2021-08-29 VITALS — Ht 64.0 in | Wt 192.0 lb

## 2021-08-29 DIAGNOSIS — Z0000 Encounter for general adult medical examination without abnormal findings: Secondary | ICD-10-CM | POA: Diagnosis not present

## 2021-08-29 NOTE — Progress Notes (Signed)
Subjective:   Krista Gonzales is a 70 y.o. female who presents for Medicare Annual (Subsequent) preventive examination.  Virtual Visit via Telephone Note  I connected with  Krista Gonzales on 08/29/21 at  3:30 PM EDT by telephone and verified that I am speaking with the correct person using two identifiers.  Location: Patient: Home Provider: [facility] Persons participating in the virtual visit: patient/Nurse Health Advisor   I discussed the limitations, risks, security and privacy concerns of performing an evaluation and management service by telephone and the availability of in person appointments. The patient expressed understanding and agreed to proceed.  Interactive audio and video telecommunications were attempted between this nurse and patient, however failed, due to patient having technical difficulties OR patient did not have access to video capability.  We continued and completed visit with audio only.  Some vital signs may be absent or patient reported.   Krista Gonzales E Krista Bronaugh, LPN   Review of Systems     Cardiac Risk Factors include: advanced age (>10men, >56 women);family history of premature cardiovascular disease;hypertension;obesity (BMI >30kg/m2)     Objective:    Today's Vitals   08/29/21 1530  Weight: 192 lb (87.1 kg)  Height: 5\' 4"  (1.626 m)   Body mass index is 32.96 kg/m.  Advanced Directives 08/29/2021 02/20/2020  Does Patient Have a Medical Advance Directive? No No  Would patient like information on creating a medical advance directive? No - Patient declined Yes (MAU/Ambulatory/Procedural Areas - Information given)    Current Medications (verified) Outpatient Encounter Medications as of 08/29/2021  Medication Sig   furosemide (LASIX) 20 MG tablet Take 1 tablet (20 mg total) by mouth daily.   lisinopril (ZESTRIL) 40 MG tablet Take 1 tablet (40 mg total) by mouth daily.   Multiple Vitamins-Minerals (MULTIVITAMIN WITH MINERALS) tablet Take 1 tablet by  mouth daily.    polyethylene glycol (MIRALAX / GLYCOLAX) 17 g packet Take 17 g by mouth daily.   Semaglutide, 1 MG/DOSE, (OZEMPIC, 1 MG/DOSE,) 4 MG/3ML SOPN Inject 1 mg into the skin once a week.   Vitamin D, Ergocalciferol, (DRISDOL) 1.25 MG (50000 UT) CAPS capsule Take 1 capsule (50,000 Units total) by mouth every 7 (seven) days.   No facility-administered encounter medications on file as of 08/29/2021.    Allergies (verified) Patient has no allergy information on record.   History: Past Medical History:  Diagnosis Date   Hypertension    Past Surgical History:  Procedure Laterality Date   ABDOMINAL HYSTERECTOMY     BREAST EXCISIONAL BIOPSY Right    GALLBLADDER SURGERY     HERNIA REPAIR     TUMOR REMOVAL     History reviewed. No pertinent family history. Social History   Socioeconomic History   Marital status: Married    Spouse name: Krista Gonzales   Number of children: 2   Years of education: 12   Highest education level: 12th grade  Occupational History   Occupation: Retired  Tobacco Use   Smoking status: Never   Smokeless tobacco: Never  Substance and Sexual Activity   Alcohol use: No   Drug use: No   Sexual activity: Not Currently  Other Topics Concern   Not on file  Social History Narrative   Lives in one level home with her husband.   Family/ children live nearby   Social Determinants of Health   Financial Resource Strain: Low Risk    Difficulty of Paying Living Expenses: Not hard at all  Food Insecurity: No Food Insecurity  Worried About Charity fundraiser in the Last Year: Never true   Krista Gonzales in the Last Year: Never true  Transportation Needs: No Transportation Needs   Lack of Transportation (Medical): No   Lack of Transportation (Non-Medical): No  Physical Activity: Sufficiently Active   Days of Exercise per Week: 5 days   Minutes of Exercise per Session: 30 min  Stress: No Stress Concern Present   Feeling of Stress : Not at all  Social  Connections: Socially Integrated   Frequency of Communication with Friends and Family: More than three times a week   Frequency of Social Gatherings with Friends and Family: More than three times a week   Attends Religious Services: More than 4 times per year   Active Member of [employer] or Organizations: Yes   Attends Archivist Meetings: 1 to 4 times per year   Marital Status: Married    Tobacco Counseling Counseling given: Not Answered   Clinical Intake:  Pre-visit preparation completed: Yes  Pain : No/denies pain     BMI - recorded: 32.96 Nutritional Status: BMI > 30  Obese Nutritional Risks: None Diabetes: No  How often do you need to have someone help you when you read instructions, pamphlets, or other written materials from your doctor or pharmacy?: 1 - Never  Diabetic? no  Interpreter Needed?: No  Information entered by :: Krista Pardue, LPN   Activities of Daily Living In your present state of health, do you have any difficulty performing the following activities: 08/29/2021  Hearing? N  Vision? N  Difficulty concentrating or making decisions? N  Walking or climbing stairs? N  Dressing or bathing? N  Doing errands, shopping? N  Preparing Food and eating ? N  Using the Toilet? N  In the past six months, have you accidently leaked urine? N  Do you have problems with loss of bowel control? N  Managing your Medications? N  Managing your Finances? N  Housekeeping or managing your Housekeeping? N  Some recent data might be hidden    Patient Care Team: Sharion Balloon, FNP as PCP - General (Family Medicine) Lavera Guise, [facility] as Pharmacist (Family Medicine)  Indicate any recent Medical Services you may have received from other than [facility] providers in the past year (date may be approximate).     Assessment:   This is a routine wellness examination for Krista Gonzales.  Hearing/Vision screen Hearing Screening - Comments:: Denies hearing difficulties  Vision  Screening - Comments:: Wears contacts or rx glasses - up to date with annual eye exams at [place] issues and exercise activities discussed: Current Exercise Habits: Home exercise routine, Type of exercise: walking;strength training/weights, Time (Minutes): 30, Frequency (Times/Week): 5, Weekly Exercise (Minutes/Week): 150, Intensity: Mild, Exercise limited by: None identified   Goals Addressed             This Visit's Progress    Have 3 meals a day   On track    02/20/2020 AWV Goal: Improved Nutrition/Diet  Patient will verbalize understanding that diet plays an important role in overall health and that a poor diet is a risk factor for many chronic medical conditions.  Over the next year, patient will improve self management of their diet by incorporating improved meal pattern, less frequent dining out, more consistent meal timing, increased physical activity, improved protein intake, better food choices, and eat 6 small meals per day . Patient will utilize available community resources  to help with food acquisition if needed (ex: food pantries, [address], etc) Patient will work with nutrition specialist if a referral was made        Depression Screen PHQ 2/9 Scores 08/29/2021 05/02/2021 05/02/2021 03/07/2021 09/02/2020 05/14/2020 02/20/2020  PHQ - 2 Score 0 0 0 0 0 0 0  PHQ- 9 Score - 5 - - - - -    Fall Risk Fall Risk  08/29/2021 05/02/2021 03/07/2021 09/02/2020 05/14/2020  Falls in the past year? 0 0 0 0 0  Number falls in past yr: 0 - - - -  Injury with Fall? 0 - - - -  Risk for fall due to : No Fall Risks - - - -  Follow up Falls prevention discussed - - - Falls evaluation completed    [place]:  Any stairs in or around the home? No  If so, are there any without handrails? No  Home free of loose throw rugs in walkways, pet beds, electrical cords, etc? Yes  Adequate lighting in your home to reduce risk of falls? Yes    ASSISTIVE DEVICES UTILIZED TO PREVENT FALLS:  Life alert? No  Use of a cane, walker or w/c? No  Grab bars in the bathroom? No  Shower chair or bench in shower? Yes  Elevated toilet seat or a handicapped toilet? No   TIMED UP AND GO:  Was the test performed? No . Telephonic visit  Cognitive Function: Normal cognitive status assessed by direct observation by this Nurse Health Advisor. No abnormalities found.       6CIT Screen 02/20/2020  What Year? 0 points  What month? 0 points  What time? 0 points  Count back from 20 0 points  Months in reverse 0 points  Repeat phrase 0 points  Total Score 0    Immunizations Immunization History  Administered Date(s) Administered   Fluad Quad(high Dose 65+) 11/01/2019, 09/02/2020   Influenza,inj,Quad PF,6+ Mos 11/19/2015, 09/07/2018   Moderna SARS-COV2 Booster Vaccination 11/10/2020   PFIZER(Purple Top)SARS-COV-2 Vaccination 01/10/2020, 01/31/2020   Pneumococcal Conjugate-13 06/08/2019   Pneumococcal Polysaccharide-23 09/02/2020   Tdap 08/22/2013, 06/26/2020    TDAP status: Up to date  Flu Vaccine status: Due, Education has been provided regarding the importance of this vaccine. Advised may receive this vaccine at local pharmacy or Health Dept. Aware to provide a copy of the vaccination record if obtained from local pharmacy or Health Dept. Verbalized acceptance and understanding.  Pneumococcal vaccine status: Up to date  Covid-19 vaccine status: Completed vaccines  Qualifies for Shingles Vaccine? Yes   Zostavax completed No   Shingrix Completed?: No.    Education has been provided regarding the importance of this vaccine. Patient has been advised to call insurance company to determine out of pocket expense if they have not yet received this vaccine. Advised may also receive vaccine at local pharmacy or Health Dept. Verbalized acceptance and understanding.  Screening Tests Health Maintenance  Topic Date Due   Zoster Vaccines-  Shingrix (1 of 2) Never done   COVID-19 Vaccine (3 - Booster for Pfizer series) 01/05/2021   INFLUENZA VACCINE  06/09/2021   DEXA SCAN  02/28/2022   MAMMOGRAM  05/07/2023   COLONOSCOPY (Pts 45-64yrs Insurance coverage will need to be confirmed)  08/17/2026   TETANUS/TDAP  06/26/2030   Pneumonia Vaccine 70+ Years old  Completed   Hepatitis C Screening  Completed   HPV VACCINES  Aged Out    Health Maintenance  Health Maintenance Due  Topic Date Due   Zoster Vaccines- Shingrix (1 of 2) Never done   COVID-19 Vaccine (3 - Booster for Pfizer series) 01/05/2021   INFLUENZA VACCINE  06/09/2021    Colorectal cancer screening: Type of screening: Colonoscopy. Completed 08/17/2016. Repeat every 10 years  Mammogram status: Completed 05/06/2021. Repeat every year  Bone Density status: Completed 02/29/2020. Results reflect: Bone density results: OSTEOPENIA. Repeat every 1 years.  Lung Cancer Screening: (Low Dose CT Chest recommended if Age 34-80 years, 30 pack-year currently smoking OR have quit w/in 15years.) does not qualify.   Additional Screening:  Hepatitis C Screening: does qualify; Completed 11/19/2015  Vision Screening: Recommended annual ophthalmology exams for early detection of glaucoma and other disorders of the eye. Is the patient up to date with their annual eye exam?  Yes  Who is the provider or what is the name of the office in which the patient attends annual eye exams? [facility] If pt is not established with a provider, would they like to be referred to a provider to establish care? No .   Dental Screening: Recommended annual dental exams for proper oral hygiene  Community Resource Referral / Chronic Care Management: CRR required this visit?  No   CCM required this visit?  No      Plan:     I have personally reviewed and noted the following in the patient's chart:   Medical and social history Use of alcohol, tobacco or illicit drugs  Current medications  and supplements including opioid prescriptions.  Functional ability and status Nutritional status Physical activity Advanced directives List of other physicians Hospitalizations, surgeries, and ER visits in previous 12 months Vitals Screenings to include cognitive, depression, and falls Referrals and appointments  In addition, I have reviewed and discussed with patient certain preventive protocols, quality metrics, and best practice recommendations. A written personalized care plan for preventive services as well as general preventive health recommendations were provided to patient.     Sandrea Hammond, LPN   [id number]/24/[id number]   Nurse Notes: None

## 2021-08-29 NOTE — Patient Instructions (Signed)
Krista Gonzales , Thank you for taking time to come for your Medicare Wellness Visit. I appreciate your ongoing commitment to your health goals. Please review the following plan we discussed and let me know if I can assist you in the future.   Screening recommendations/referrals: Colonoscopy: Done 08/17/2016 - Repeat in 10 years Mammogram: Done 05/06/2021 - Repeat annually Bone Density: Done 02/29/2020 - Repeat in one year - 2 if insurance won't cover Recommended yearly ophthalmology/optometry visit for glaucoma screening and checkup Recommended yearly dental visit for hygiene and checkup  Vaccinations: Influenza vaccine: Done 09/02/2020 - Repeat annually Pneumococcal vaccine: Done 06/08/2019 & 09/02/2020 Tdap vaccine: Done 06/26/2020 - Repeat in 10 years Shingles vaccine: Due   Covid-19: Done 01/10/2020, 01/31/2020, 11/10/2020  Advanced directives: Please bring a copy of your health care power of attorney and living will to the office to be added to your chart at your convenience.   Conditions/risks identified: Aim for 30 minutes of exercise or brisk walking each day, drink 6-8 glasses of water and eat lots of fruits and vegetables.   Next appointment: Follow up in one year for your annual wellness visit    Preventive Care 65 Years and Older, Female Preventive care refers to lifestyle choices and visits with your health care provider that can promote health and wellness. What does preventive care include? A yearly physical exam. This is also called an annual well check. Dental exams once or twice a year. Routine eye exams. Ask your health care provider how often you should have your eyes checked. Personal lifestyle choices, including: Daily care of your teeth and gums. Regular physical activity. Eating a healthy diet. Avoiding tobacco and drug use. Limiting alcohol use. Practicing safe sex. Taking low-dose aspirin every day. Taking vitamin and mineral supplements as recommended by your  health care provider. What happens during an annual well check? The services and screenings done by your health care provider during your annual well check will depend on your age, overall health, lifestyle risk factors, and family history of disease. Counseling  Your health care provider may ask you questions about your: Alcohol use. Tobacco use. Drug use. Emotional well-being. Home and relationship well-being. Sexual activity. Eating habits. History of falls. Memory and ability to understand (cognition). Work and work Statistician. Reproductive health. Screening  You may have the following tests or measurements: Height, weight, and BMI. Blood pressure. Lipid and cholesterol levels. These may be checked every 5 years, or more frequently if you are over 73 years old. Skin check. Lung cancer screening. You may have this screening every year starting at age 22 if you have a 30-pack-year history of smoking and currently smoke or have quit within the past 15 years. Fecal occult blood test (FOBT) of the stool. You may have this test every year starting at age 58. Flexible sigmoidoscopy or colonoscopy. You may have a sigmoidoscopy every 5 years or a colonoscopy every 10 years starting at age 29. Hepatitis C blood test. Hepatitis B blood test. Sexually transmitted disease (STD) testing. Diabetes screening. This is done by checking your blood sugar (glucose) after you have not eaten for a while (fasting). You may have this done every 1-3 years. Bone density scan. This is done to screen for osteoporosis. You may have this done starting at age 49. Mammogram. This may be done every 1-2 years. Talk to your health care provider about how often you should have regular mammograms. Talk with your health care provider about your test results, treatment options,  and if necessary, the need for more tests. Vaccines  Your health care provider may recommend certain vaccines, such as: Influenza vaccine.  This is recommended every year. Tetanus, diphtheria, and acellular pertussis (Tdap, Td) vaccine. You may need a Td booster every 10 years. Zoster vaccine. You may need this after age 44. Pneumococcal 13-valent conjugate (PCV13) vaccine. One dose is recommended after age 24. Pneumococcal polysaccharide (PPSV23) vaccine. One dose is recommended after age 42. Talk to your health care provider about which screenings and vaccines you need and how often you need them. This information is not intended to replace advice given to you by your health care provider. Make sure you discuss any questions you have with your health care provider. Document Released: 11/22/2015 Document Revised: 07/15/2016 Document Reviewed: 08/27/2015 Elsevier Interactive Patient Education  2017 [place] Prevention in the Home Falls can cause injuries. They can happen to people of all ages. There are many things you can do to make your home safe and to help prevent falls. What can I do on the outside of my home? Regularly fix the edges of walkways and driveways and fix any cracks. Remove anything that might make you trip as you walk through a door, such as a raised step or threshold. Trim any bushes or trees on the path to your home. Use bright outdoor lighting. Clear any walking paths of anything that might make someone trip, such as rocks or tools. Regularly check to see if handrails are loose or broken. Make sure that both sides of any steps have handrails. Any raised decks and porches should have guardrails on the edges. Have any leaves, snow, or ice cleared regularly. Use sand or salt on walking paths during winter. Clean up any spills in your garage right away. This includes oil or grease spills. What can I do in the bathroom? Use night lights. Install grab bars by the toilet and in the tub and shower. Do not use towel bars as grab bars. Use non-skid mats or decals in the tub or shower. If you need to sit  down in the shower, use a plastic, non-slip stool. Keep the floor dry. Clean up any water that spills on the floor as soon as it happens. Remove soap buildup in the tub or shower regularly. Attach bath mats securely with double-sided non-slip rug tape. Do not have throw rugs and other things on the floor that can make you trip. What can I do in the bedroom? Use night lights. Make sure that you have a light by your bed that is easy to reach. Do not use any sheets or blankets that are too big for your bed. They should not hang down onto the floor. Have a firm chair that has side arms. You can use this for support while you get dressed. Do not have throw rugs and other things on the floor that can make you trip. What can I do in the kitchen? Clean up any spills right away. Avoid walking on wet floors. Keep items that you use a lot in easy-to-reach places. If you need to reach something above you, use a strong step stool that has a grab bar. Keep electrical cords out of the way. Do not use floor polish or wax that makes floors slippery. If you must use wax, use non-skid floor wax. Do not have throw rugs and other things on the floor that can make you trip. What can I do with my stairs? Do not leave  any items on the stairs. Make sure that there are handrails on both sides of the stairs and use them. Fix handrails that are broken or loose. Make sure that handrails are as long as the stairways. Check any carpeting to make sure that it is firmly attached to the stairs. Fix any carpet that is loose or worn. Avoid having throw rugs at the top or bottom of the stairs. If you do have throw rugs, attach them to the floor with carpet tape. Make sure that you have a light switch at the top of the stairs and the bottom of the stairs. If you do not have them, ask someone to add them for you. What else can I do to help prevent falls? Wear shoes that: Do not have high heels. Have rubber bottoms. Are  comfortable and fit you well. Are closed at the toe. Do not wear sandals. If you use a stepladder: Make sure that it is fully opened. Do not climb a closed stepladder. Make sure that both sides of the stepladder are locked into place. Ask someone to hold it for you, if possible. Clearly mark and make sure that you can see: Any grab bars or handrails. First and last steps. Where the edge of each step is. Use tools that help you move around (mobility aids) if they are needed. These include: Canes. Walkers. Scooters. Crutches. Turn on the lights when you go into a dark area. Replace any light bulbs as soon as they burn out. Set up your furniture so you have a clear path. Avoid moving your furniture around. If any of your floors are uneven, fix them. If there are any pets around you, be aware of where they are. Review your medicines with your doctor. Some medicines can make you feel dizzy. This can increase your chance of falling. Ask your doctor what other things that you can do to help prevent falls. This information is not intended to replace advice given to you by your health care provider. Make sure you discuss any questions you have with your health care provider. Document Released: 08/22/2009 Document Revised: 04/02/2016 Document Reviewed: 11/30/2014 Elsevier Interactive Patient Education  2017 [employer].

## 2021-09-05 ENCOUNTER — Ambulatory Visit (INDEPENDENT_AMBULATORY_CARE_PROVIDER_SITE_OTHER): Payer: Medicare HMO | Admitting: Pharmacist

## 2021-09-05 DIAGNOSIS — E8881 Metabolic syndrome: Secondary | ICD-10-CM

## 2021-09-05 DIAGNOSIS — E782 Mixed hyperlipidemia: Secondary | ICD-10-CM

## 2021-09-05 NOTE — Progress Notes (Signed)
  Chronic Care Management Pharmacy Note  09/05/2021 Name:  Krista Gonzales MRN:  [id number] DOB:  03/30/1951  Summary: Metabolic syndrome  Recommendations/Changes made from today's visit: Overweight/Obesity Complicated by hyperlipidemia: Unable to achieve goal weight loss through lifestyle modification alone; current treatment; n/a --> restarting Ozempic Medications/Strategies previously tried: ozempic Tolerating ozempic 0.25mg well--previous notes: had great results, however began to have GI issues.  She now thinks GI issues were unrelated to Ozempic CONTINUE 0.5mg sq weekly for 4 weeks, then increase to 1mg sq weekly for 4 weeks.  Will titrate as tolerated Denies personal and family history of Medullary thyroid cancer (MTC) ENROLLED IN [employer] PATIENT ASSISTANCE PROGRAM UNTIL 11/08/21--NEED TO RE ENROLL, PATIENT TO BRING FORMS IN November We [place] to benefits of ozempic/weight loss in lipid panel Baseline weight: 192lbs; most recent weight: 192 lbs Current meal patterns: has difficulty snacking/carbs Current exercise: active/works on projects outside, etc Extensive dietary counseling including education on focus on lean proteins, fruits and vegetables, whole grains and increased fiber consumption, adequate hydration Extensive exercise counseling including eventual goal of 150 minutes of moderate intensity exercise weekly Recommended restart ozempic Assessed patient finances. [facility] rec'd the refills 07/31/21 but at the time ozempic was on backorder. they were just told last week that the medication is back in & orders are being shipped. This pt has 5 boxes that are set to ship, but no exact delivery date   Lipid Panel     Component Value Date/Time   CHOL 189 03/07/2021 1507   TRIG 139 03/07/2021 1507   TRIG 105 05/02/2014 1655   HDL 54 03/07/2021 1507   HDL 57 05/02/2014 1655   CHOLHDL 3.5 03/07/2021 1507   LDLCALC 111 (H) 03/07/2021 1507   LDLCALC 100 (H) 05/02/2014 1655    LABVLDL 24 03/07/2021 1507    Follow Up Plan: Telephone follow up appointment with care management team member scheduled for: 6 weeks   Subjective: Krista Gonzales is an 70 y.o. year old female who is a primary patient of Hawks, Christy A, FNP.  The CCM team was consulted for assistance with disease management and care coordination needs.    Engaged with patient by telephone for follow up visit in response to provider referral for pharmacy case management and/or care coordination services.   Consent to Services:  The patient was given information about Chronic Care Management services, agreed to services, and gave verbal consent prior to initiation of services.  Please see initial visit note for detailed documentation.   Patient Care Team: Hawks, Christy A, FNP as PCP - General (Family Medicine) Pruitt, Julie D, RPH as Pharmacist (Family Medicine)  Objective:  Lab Results  Component Value Date   CREATININE 0.82 03/07/2021   CREATININE 0.78 05/14/2020   CREATININE 0.80 02/14/2020    No results found for: HGBA1C Last diabetic Eye exam: No results found for: HMDIABEYEEXA  Last diabetic Foot exam: No results found for: HMDIABFOOTEX      Component Value Date/Time   CHOL 189 03/07/2021 1507   TRIG 139 03/07/2021 1507   TRIG 105 05/02/2014 1655   HDL 54 03/07/2021 1507   HDL 57 05/02/2014 1655   CHOLHDL 3.5 03/07/2021 1507   LDLCALC 111 (H) 03/07/2021 1507   LDLCALC 100 (H) 05/02/2014 1655    Hepatic Function Latest Ref Rng & Units 03/07/2021 05/14/2020 02/14/2020  Total Protein 6.0 - 8.5 g/dL 6.6 5.7(L) 6.8  Albumin 3.8 - 4.8 g/dL 4.6 3.7(L) 4.6  AST 0 - 40   IU/L _0 ALT 0 - 32 IU/L _1 Alk Phosphatase 44 - 121 IU/L 86 62 86  Total Bilirubin 0.0 - 1.2 mg/dL 0.6 0.2 0.6    Lab Results  Component Value Date/Time   TSH 1.200 03/07/2021 03:07 PM   TSH 0.898 11/01/2019 03:41 PM    CBC Latest Ref Rng & Units 03/07/2021 05/14/2020 11/01/2019  WBC 3.4 - 10.8  x10E3/uL 6.6 5.4 5.4  Hemoglobin 11.1 - 15.9 g/dL 13.7 12.7 12.6  Hematocrit 34.0 - 46.6 % 41.7 39.1 38.3  Platelets 150 - 450 x10E3/uL 260 265 251    Lab Results  Component Value Date/Time   VD25OH 34.2 03/07/2021 03:07 PM   VD25OH 40.7 02/14/2020 03:46 PM    Clinical ASCVD: No  The 10-year ASCVD risk score (Arnett DK, et al., 2019) is: 10.7%   Values used to calculate the score:     Age: 70 years     Sex: Female     Is Non-Hispanic African American: No     Diabetic: No     Tobacco smoker: No     Systolic Blood Pressure: 937 mmHg     Is BP treated: Yes     HDL Cholesterol: 54 mg/dL     Total Cholesterol: 189 mg/dL    Other: (CHADS2VASc if Afib, PHQ9 if depression, MMRC or CAT for COPD, ACT, DEXA)  Social History   Tobacco Use  Smoking Status Never  Smokeless Tobacco Never   BP Readings from Last 3 Encounters:  05/02/21 131/81  03/07/21 111/76  09/02/20 116/73   Pulse Readings from Last 3 Encounters:  05/02/21 80  03/07/21 78  09/02/20 73   Wt Readings from Last 3 Encounters:  08/29/21 192 lb (87.1 kg)  05/02/21 192 lb 3.2 oz (87.2 kg)  03/07/21 188 lb 12.8 oz (85.6 kg)    Assessment: Review of patient past medical history, allergies, medications, health status, including review of consultants reports, laboratory and other test data, was performed as part of comprehensive evaluation and provision of chronic care management services.   SDOH:  (Social Determinants of Health) assessments and interventions performed:    [place]  Not on File  Medications Reviewed Today     Reviewed by Lavera Guise, [facility] (Pharmacist) on 09/05/21 at 0905  Med List Status: <None>   Medication Order Taking? Sig Documenting Provider Last Dose Status Informant  furosemide (LASIX) 20 MG tablet [id number] No Take 1 tablet (20 mg total) by mouth daily. Sharion Balloon, FNP Taking Active   lisinopril (ZESTRIL) 40 MG tablet [id number] No Take 1 tablet (40 mg total) by mouth  daily. Sharion Balloon, FNP Taking Active   Multiple Vitamins-Minerals (MULTIVITAMIN WITH MINERALS) tablet [id number] No Take 1 tablet by mouth daily.  [provider] Taking Active   polyethylene glycol (MIRALAX / GLYCOLAX) 17 g packet [id number] No Take 17 g by mouth daily. Sharion Balloon, FNP Taking Active   Semaglutide, 1 MG/DOSE, (OZEMPIC, 1 MG/DOSE,) 4 MG/3ML SOPN [id number] No Inject 1 mg into the skin once a week. Sharion Balloon, FNP Taking Active            Med Note Blanca Friend, Royce Macadamia   Tue Aug 05, 2021  9:58 AM)  Via [employer] patient assistance program  Vitamin D, Ergocalciferol, (DRISDOL) 1.25 MG (50000 UT) CAPS capsule [id number] No Take 1 capsule (50,000 Units total) by mouth every 7 (seven) days. [name], FNP Taking Active  Patient Active Problem List   Diagnosis Date Noted   GAD (generalized anxiety disorder) 03/07/2021   Metabolic syndrome 10/15/2020   Osteoporosis 03/05/2020   OAB (overactive bladder) 02/14/2020   Vitamin D deficiency 06/08/2019   B12 deficiency 06/08/2019   Peripheral edema 11/19/2015   Obesity (BMI 30.0-34.9) 11/19/2015   Hypertension     Immunization History  Administered Date(s) Administered   Fluad Quad(high Dose 65+) 11/01/2019, 09/02/2020   Influenza,inj,Quad PF,6+ Mos 11/19/2015, 09/07/2018   Moderna SARS-COV2 Booster Vaccination 11/10/2020   PFIZER(Purple Top)SARS-COV-2 Vaccination 01/10/2020, 01/31/2020   Pneumococcal Conjugate-13 06/08/2019   Pneumococcal Polysaccharide-23 09/02/2020   Tdap 08/22/2013, 06/26/2020    Conditions to be addressed/monitored: HLD, METABOLIC SYNDROME  There are no care plans that you recently modified to display for this patient.   Medication Assistance:  OZEMPIC obtained through [employer] medication assistance program.  Enrollment ends 11/08/21  Patient's preferred pharmacy is:  [facility] - [place], [place] - [address] [place] [address] Phone: [phone] Fax: [phone]   Follow Up:  Patient agrees to Care Plan and Follow-up.  Plan: Telephone follow up appointment with care management team member scheduled for:  6 WEEKS    Julie Dattero Pruitt, PharmD, BCPS Clinical Pharmacist, [facility] [place]  II Phone [phone]      

## 2021-09-05 NOTE — Patient Instructions (Signed)
Visit Information  PATIENT GOALS:  Goals Addressed               This Visit's Progress     Patient Stated     Metabolic Syndrome/Obesity (pt-stated)        Current Barriers:  Unable to independently afford treatment regimen Unable to achieve control of metabolic syndrome   Pharmacist Clinical Goal(s):  Over the next 90 days, patient will verbalize ability to afford treatment regimen achieve control of metabolic syndrome as evidenced by decreased weight, improved lipid panel, improved quality of life through collaboration with PharmD and provider.   Interventions: 1:1 collaboration with Sharion Balloon, FNP regarding development and update of comprehensive plan of care as evidenced by provider attestation and co-signature Inter-disciplinary care team collaboration (see longitudinal plan of care) Comprehensive medication review performed; medication list updated in electronic medical record  Overweight/Obesity Complicated by hyperlipidemia: Unable to achieve goal weight loss through lifestyle modification alone; current treatment; n/a --> restarting Ozempic Medications/Strategies previously tried: ozempic Tolerating ozempic 0.25mg  well--previous notes: had great results, however began to have GI issues.  She now thinks GI issues were unrelated to Ozempic CONTINUE 0.5mg  sq weekly for 4 weeks, then increase to 1mg  sq weekly for 4 weeks.  Will titrate as tolerated Denies personal and family history of Medullary thyroid cancer (MTC) ENROLLED IN [facility] [place] PATIENT ASSISTANCE PROGRAM UNTIL 11/08/21--NEED TO RE ENROLL, PATIENT TO BRING FORMS IN November We [place] to benefits of ozempic/weight loss in lipid panel Baseline weight: 192lbs; most recent weight: 192 lbs Current meal patterns: has difficulty snacking/carbs Current exercise: active/works on projects outside, etc Extensive dietary counseling including education on focus on lean proteins, fruits and vegetables, whole grains and  increased fiber consumption, adequate hydration Extensive exercise counseling including eventual goal of 150 minutes of moderate intensity exercise weekly Recommended restart ozempic Assessed patient finances. [facility] rec'd the refills 07/31/21 but at the time ozempic was on backorder. they were just told last week that the medication is back in & orders are being shipped. This pt has 5 boxes that are set to ship, but no exact delivery date   Lipid Panel     Component Value Date/Time   CHOL 189 03/07/2021 1507   TRIG 139 03/07/2021 1507   TRIG 105 05/02/2014 1655   HDL 54 03/07/2021 1507   HDL 57 05/02/2014 1655   CHOLHDL 3.5 03/07/2021 1507   LDLCALC 111 (H) 03/07/2021 1507   LDLCALC 100 (H) 05/02/2014 1655   LABVLDL 24 03/07/2021 1507   Patient Goals/Self-Care Activities Over the next 90 days, patient will:  - take medications as prescribed collaborate with provider on medication access solutions target a minimum of 150 minutes of moderate intensity exercise weekly engage in dietary modifications by following healthy plate method   Follow Up Plan: Telephone follow up appointment with care management team member scheduled for: 6 weeks         The patient verbalized understanding of instructions, educational materials, and care plan provided today and declined offer to receive copy of patient instructions, educational materials, and care plan.   Telephone follow up appointment with care management team member scheduled for: 6 WEEKS   Regina Eck, PharmD, BCPS Clinical Pharmacist, [place]  II Phone [phone]

## 2021-09-08 DIAGNOSIS — E663 Overweight: Secondary | ICD-10-CM

## 2021-09-08 DIAGNOSIS — E782 Mixed hyperlipidemia: Secondary | ICD-10-CM

## 2021-09-17 ENCOUNTER — Telehealth: Payer: Self-pay | Admitting: Pharmacist

## 2021-09-17 NOTE — Telephone Encounter (Signed)
4 month Ozempic patient assistance shipment arrived Patient made aware

## 2021-10-13 ENCOUNTER — Other Ambulatory Visit: Payer: Self-pay | Admitting: *Deleted

## 2021-10-13 DIAGNOSIS — I10 Essential (primary) hypertension: Secondary | ICD-10-CM

## 2021-10-13 NOTE — Telephone Encounter (Signed)
Hawks. NTBS 6 mos rck was to be in Oct. Mail order not sent

## 2021-10-14 NOTE — Telephone Encounter (Signed)
Pt aware she needs appt in order to have medication refilled, she stated she will call back in a few days to make appt

## 2021-10-15 ENCOUNTER — Other Ambulatory Visit: Payer: Self-pay | Admitting: *Deleted

## 2021-10-15 DIAGNOSIS — I10 Essential (primary) hypertension: Secondary | ICD-10-CM

## 2021-10-15 MED ORDER — LISINOPRIL 40 MG PO TABS: 40.0000 mg | ORAL_TABLET | Freq: Every day | ORAL | 2 refills | Status: DC

## 2021-11-25 NOTE — Progress Notes (Signed)
Received notification from [place] regarding approval for [place]. Patient assistance approved from 11/13/21 to 10/08/22.  Phone: [phone]

## 2021-12-18 ENCOUNTER — Telehealth: Payer: Self-pay

## 2021-12-18 NOTE — Telephone Encounter (Signed)
Lm letting pt know pt assistance is ready for pickup

## 2022-01-06 DIAGNOSIS — H5203 Hypermetropia, bilateral: Secondary | ICD-10-CM | POA: Diagnosis not present

## 2022-01-06 DIAGNOSIS — Z135 Encounter for screening for eye and ear disorders: Secondary | ICD-10-CM | POA: Diagnosis not present

## 2022-01-06 DIAGNOSIS — D3132 Benign neoplasm of left choroid: Secondary | ICD-10-CM | POA: Diagnosis not present

## 2022-01-06 DIAGNOSIS — H524 Presbyopia: Secondary | ICD-10-CM | POA: Diagnosis not present

## 2022-01-06 DIAGNOSIS — H40019 Open angle with borderline findings, low risk, unspecified eye: Secondary | ICD-10-CM | POA: Diagnosis not present

## 2022-03-03 DIAGNOSIS — Z800 Family history of malignant neoplasm of digestive organs: Secondary | ICD-10-CM | POA: Diagnosis not present

## 2022-03-03 DIAGNOSIS — Z1211 Encounter for screening for malignant neoplasm of colon: Secondary | ICD-10-CM | POA: Diagnosis not present

## 2022-03-03 DIAGNOSIS — K5730 Diverticulosis of large intestine without perforation or abscess without bleeding: Secondary | ICD-10-CM | POA: Diagnosis not present

## 2022-03-03 DIAGNOSIS — D123 Benign neoplasm of transverse colon: Secondary | ICD-10-CM | POA: Diagnosis not present

## 2022-03-12 ENCOUNTER — Telehealth: Payer: Self-pay | Admitting: Pharmacist

## 2022-03-12 NOTE — Telephone Encounter (Signed)
Ozempic here for pick up ?Patient made aware ?

## 2022-04-03 ENCOUNTER — Ambulatory Visit (INDEPENDENT_AMBULATORY_CARE_PROVIDER_SITE_OTHER): Payer: Medicare HMO | Admitting: Family Medicine

## 2022-04-03 ENCOUNTER — Encounter: Payer: Self-pay | Admitting: Family Medicine

## 2022-04-03 VITALS — BP 134/80 | HR 55 | Temp 97.8°F | Ht 64.0 in | Wt 180.0 lb

## 2022-04-03 DIAGNOSIS — H9193 Unspecified hearing loss, bilateral: Secondary | ICD-10-CM

## 2022-04-03 DIAGNOSIS — H6123 Impacted cerumen, bilateral: Secondary | ICD-10-CM | POA: Diagnosis not present

## 2022-04-03 NOTE — Patient Instructions (Signed)

## 2022-04-03 NOTE — Progress Notes (Signed)
   Acute Office Visit  Subjective:     Patient ID: Krista Gonzales, female    DOB: 04-21-1951, 71 y.o.   MRN: [id number]  Chief Complaint  Patient presents with  . Hearing Problem    HPI Patient is in today for ***  ROS      Objective:    BP 134/80   Pulse (!) 55   Temp 97.8 F (36.6 C) (Temporal)   Ht '5\' 4"'$  (1.626 m)   Wt 180 lb (81.6 kg)   SpO2 96%   BMI 30.90 kg/m  {Vitals History (Optional):23777}  Physical Exam  No results found for any visits on 04/03/22.      Assessment & Plan:   Problem List Items Addressed This Visit   None   No orders of the defined types were placed in this encounter.   No follow-ups on file.  Gwenlyn Perking, FNP

## 2022-04-14 ENCOUNTER — Ambulatory Visit: Payer: Medicare HMO | Admitting: Audiologist

## 2022-04-21 ENCOUNTER — Encounter: Payer: Self-pay | Admitting: Family Medicine

## 2022-04-21 ENCOUNTER — Ambulatory Visit: Payer: Medicare HMO

## 2022-04-21 ENCOUNTER — Ambulatory Visit (INDEPENDENT_AMBULATORY_CARE_PROVIDER_SITE_OTHER): Payer: Medicare HMO | Admitting: Family Medicine

## 2022-04-21 VITALS — BP 110/73 | HR 64 | Temp 97.3°F | Ht 64.0 in | Wt 179.2 lb

## 2022-04-21 DIAGNOSIS — M7522 Bicipital tendinitis, left shoulder: Secondary | ICD-10-CM

## 2022-04-21 MED ORDER — PREDNISONE 20 MG PO TABS: ORAL_TABLET | ORAL | 0 refills | Status: DC

## 2022-04-21 NOTE — Progress Notes (Signed)
BP 110/73   Pulse 64   Temp (!) 97.3 F (36.3 C)   Ht '5\' 4"'$  (1.626 m)   Wt 179 lb 4 oz (81.3 kg)   SpO2 97%   BMI 30.77 kg/m    Subjective:   Patient ID: Krista Gonzales, female    DOB: 06-13-1951, 71 y.o.   MRN: [id number]  HPI: Krista Gonzales is a 71 y.o. female presenting on 04/21/2022 for Shoulder Pain (Left. Injured about 1 month ago. Not improving)   HPI Left shoulder pain. Patient has been having left shoulder pain since she injured it about a month ago.  She says she was working with her husband on the garage door and it jerked up on her and pulled her arm up and behind her.  She says she has been using ibuprofen and anti-inflammatories and trying to get it calm down but it just does not seem to want to calm down.  She says it hurts more with overhead and reaching back motion.  It hurts mostly on the front of her shoulder.  She denies any numbness or weakness.  Relevant past medical, surgical, family and social history reviewed and updated as indicated. Interim medical history since our last visit reviewed. Allergies and medications reviewed and updated.  Review of Systems  Constitutional:  Negative for chills and fever.  Eyes:  Negative for visual disturbance.  Respiratory:  Negative for chest tightness and shortness of breath.   Cardiovascular:  Negative for chest pain and leg swelling.  Musculoskeletal:  Positive for arthralgias and myalgias. Negative for back pain and gait problem.  Skin:  Negative for rash.  Neurological:  Negative for light-headedness and headaches.  Psychiatric/Behavioral:  Negative for agitation and behavioral problems.   All other systems reviewed and are negative.   Per HPI unless specifically indicated above   Allergies as of 04/21/2022   No Known Allergies      Medication List        Accurate as of April 21, 2022 11:15 AM. If you have any questions, ask your nurse or doctor.          furosemide 20 MG tablet Commonly  known as: LASIX Take 1 tablet (20 mg total) by mouth daily.   lisinopril 40 MG tablet Commonly known as: ZESTRIL Take 1 tablet (40 mg total) by mouth daily.   multivitamin with minerals tablet Take 1 tablet by mouth daily.   Ozempic (1 MG/DOSE) 4 MG/3ML Sopn Generic drug: Semaglutide (1 MG/DOSE) Inject 1 mg into the skin once a week.   polyethylene glycol 17 g packet Commonly known as: MIRALAX / GLYCOLAX Take 17 g by mouth daily.   predniSONE 20 MG tablet Commonly known as: DELTASONE Take 3 tabs daily for 1 week, then 2 tabs daily for week 2, then 1 tab daily for week 3. Started by: Worthy Rancher, MD   Vitamin D (Ergocalciferol) 1.25 MG (50000 UNIT) Caps capsule Commonly known as: DRISDOL Take 1 capsule (50,000 Units total) by mouth every 7 (seven) days.         Objective:   BP 110/73   Pulse 64   Temp (!) 97.3 F (36.3 C)   Ht '5\' 4"'$  (1.626 m)   Wt 179 lb 4 oz (81.3 kg)   SpO2 97%   BMI 30.77 kg/m   Wt Readings from Last 3 Encounters:  04/21/22 179 lb 4 oz (81.3 kg)  04/03/22 180 lb (81.6 kg)  08/29/21 192 lb (87.1 kg)  Physical Exam Vitals and nursing note reviewed.  Constitutional:      General: She is not in acute distress.    Appearance: She is well-developed. She is not diaphoretic.  Eyes:     Conjunctiva/sclera: Conjunctivae normal.  Musculoskeletal:        General: Normal range of motion.     Left shoulder: Tenderness (Anterior tenderness) present. No swelling, deformity, bony tenderness or crepitus. Normal range of motion. Normal strength.  Skin:    General: Skin is warm and dry.     Findings: No rash.  Neurological:     Mental Status: She is alert and oriented to person, place, and time.     Coordination: Coordination normal.  Psychiatric:        Behavior: Behavior normal.       Assessment & Plan:   Problem List Items Addressed This Visit   None Visit Diagnoses     Biceps tendinitis of left upper extremity    -  Primary    Relevant Medications   predniSONE (DELTASONE) 20 MG tablet       Based on location of the the biceps tendinitis or capsular tear.  We will treat with anti-inflammatories with prednisone and see if we can calm it down.  If does not calm down from there then we will send her to physical therapy.  May need MRI in the future if continues to be problematic but I think we can calm it down without Follow up plan: Return if symptoms worsen or fail to improve.  Counseling provided for all of the vaccine components No orders of the defined types were placed in this encounter.   Caryl Pina, MD [facility] 04/21/2022, 11:15 AM

## 2022-04-27 ENCOUNTER — Other Ambulatory Visit: Payer: Self-pay | Admitting: Family Medicine

## 2022-04-27 DIAGNOSIS — Z1231 Encounter for screening mammogram for malignant neoplasm of breast: Secondary | ICD-10-CM

## 2022-04-28 DIAGNOSIS — H906 Mixed conductive and sensorineural hearing loss, bilateral: Secondary | ICD-10-CM | POA: Diagnosis not present

## 2022-04-29 DIAGNOSIS — D235 Other benign neoplasm of skin of trunk: Secondary | ICD-10-CM | POA: Diagnosis not present

## 2022-04-29 DIAGNOSIS — L814 Other melanin hyperpigmentation: Secondary | ICD-10-CM | POA: Diagnosis not present

## 2022-04-29 DIAGNOSIS — D225 Melanocytic nevi of trunk: Secondary | ICD-10-CM | POA: Diagnosis not present

## 2022-04-29 DIAGNOSIS — L579 Skin changes due to chronic exposure to nonionizing radiation, unspecified: Secondary | ICD-10-CM | POA: Diagnosis not present

## 2022-04-29 DIAGNOSIS — L821 Other seborrheic keratosis: Secondary | ICD-10-CM | POA: Diagnosis not present

## 2022-05-04 DIAGNOSIS — H40019 Open angle with borderline findings, low risk, unspecified eye: Secondary | ICD-10-CM | POA: Diagnosis not present

## 2022-05-07 ENCOUNTER — Ambulatory Visit
Admission: RE | Admit: 2022-05-07 | Discharge: 2022-05-07 | Disposition: A | Discharge status: Home / Self Care | Payer: Medicare HMO | Source: Ambulatory Visit | Attending: Family Medicine | Admitting: Family Medicine

## 2022-05-07 DIAGNOSIS — Z1231 Encounter for screening mammogram for malignant neoplasm of breast: Secondary | ICD-10-CM

## 2022-05-15 ENCOUNTER — Encounter: Payer: Self-pay | Admitting: Family Medicine

## 2022-05-15 ENCOUNTER — Ambulatory Visit (INDEPENDENT_AMBULATORY_CARE_PROVIDER_SITE_OTHER): Payer: Medicare HMO | Admitting: Family Medicine

## 2022-05-15 DIAGNOSIS — I10 Essential (primary) hypertension: Secondary | ICD-10-CM | POA: Diagnosis not present

## 2022-05-15 MED ORDER — FUROSEMIDE 20 MG PO TABS: 20.0000 mg | ORAL_TABLET | Freq: Every day | ORAL | 3 refills | Status: DC

## 2022-05-15 MED ORDER — LISINOPRIL 20 MG PO TABS: 20.0000 mg | ORAL_TABLET | Freq: Every day | ORAL | 3 refills | Status: DC

## 2022-05-15 NOTE — Patient Instructions (Signed)

## 2022-05-15 NOTE — Progress Notes (Signed)
Subjective:  Patient ID: Krista Gonzales, female    DOB: 1951-09-12, 71 y.o.   MRN: [id number]  Patient Care Team: [name], FNP as PCP - General (Family Medicine) Lavera Guise, [facility] as Pharmacist (Family Medicine)   Chief Complaint:  Hypertension (Pt states it has been ranging a little low 80/55 )   HPI: Krista Gonzales is a 71 y.o. female presenting on 05/15/2022 for Hypertension (Pt states it has been ranging a little low 80/55 )   Hypertension This is a recurrent problem. The current episode started more than 1 month ago. The problem has been rapidly improving since onset. The problem is controlled. Pertinent negatives include no anxiety, blurred vision, chest pain, headaches, malaise/fatigue, neck pain, orthopnea, palpitations, peripheral edema, PND, shortness of breath or sweats. There are no associated agents to hypertension. Risk factors for coronary artery disease include post-menopausal state. Past treatments include ACE inhibitors and diuretics. The current treatment provides significant improvement. There are no compliance problems.     Relevant past medical, surgical, family, and social history reviewed and updated as indicated.  Allergies and medications reviewed and updated. Data reviewed: Chart in Epic.   Past Medical History:  Diagnosis Date   Hypertension     Past Surgical History:  Procedure Laterality Date   ABDOMINAL HYSTERECTOMY     BREAST EXCISIONAL BIOPSY Right    GALLBLADDER SURGERY     HERNIA REPAIR     TUMOR REMOVAL      Social History   Socioeconomic History   Marital status: Married    Spouse name: [name]   Number of children: 2   Years of education: 12   Highest education level: 12th grade  Occupational History   Occupation: Retired  Tobacco Use   Smoking status: Never   Smokeless tobacco: Never  Substance and Sexual Activity   Alcohol use: No   Drug use: No   Sexual activity: Not Currently  Other Topics Concern    Not on file  Social History Narrative   Lives in one level home with her husband.   Family/ children live nearby   Social Determinants of Health   Financial Resource Strain: Low Risk  (08/29/2021)   Overall Financial Resource Strain (CARDIA)    Difficulty of Paying Living Expenses: Not hard at all  Food Insecurity: No Food Insecurity (08/29/2021)   Hunger Vital Sign    Worried About Running Out of Food in the Last Year: Never true    Ran Out of Food in the Last Year: Never true  Transportation Needs: No Transportation Needs (08/29/2021)   PRAPARE - Hydrologist (Medical): No    Lack of Transportation (Non-Medical): No  Physical Activity: Sufficiently Active (08/29/2021)   Exercise Vital Sign    Days of Exercise per Week: 5 days    Minutes of Exercise per Session: 30 min  Stress: No Stress Concern Present (08/29/2021)   [place]    Feeling of Stress : Not at all  Social Connections: [place] (08/29/2021)   Social Connection and Isolation Panel [NHANES]    Frequency of Communication with Friends and Family: More than three times a week    Frequency of Social Gatherings with Friends and Family: More than three times a week    Attends Religious Services: More than 4 times per year    Active Member of Clubs or Organizations: Yes    Attends  Club or Organization Meetings: 1 to 4 times per year    Marital Status: Married  Human resources officer Violence: Not At Risk (08/29/2021)   Humiliation, Afraid, Rape, and Kick questionnaire    Fear of Current or Ex-Partner: No    Emotionally Abused: No    Physically Abused: No    Sexually Abused: No    Outpatient Encounter Medications as of 05/15/2022  Medication Sig   lisinopril (ZESTRIL) 20 MG tablet Take 1 tablet (20 mg total) by mouth daily.   Multiple Vitamins-Minerals (MULTIVITAMIN WITH MINERALS) tablet Take 1 tablet by mouth daily.     polyethylene glycol (MIRALAX / GLYCOLAX) 17 g packet Take 17 g by mouth daily.   predniSONE (DELTASONE) 20 MG tablet Take 3 tabs daily for 1 week, then 2 tabs daily for week 2, then 1 tab daily for week 3.   Semaglutide, 1 MG/DOSE, (OZEMPIC, 1 MG/DOSE,) 4 MG/3ML SOPN Inject 1 mg into the skin once a week.   Vitamin D, Ergocalciferol, (DRISDOL) 1.25 MG (50000 UT) CAPS capsule Take 1 capsule (50,000 Units total) by mouth every 7 (seven) days.   [DISCONTINUED] furosemide (LASIX) 20 MG tablet Take 1 tablet (20 mg total) by mouth daily.   [DISCONTINUED] lisinopril (ZESTRIL) 40 MG tablet Take 1 tablet (40 mg total) by mouth daily.   furosemide (LASIX) 20 MG tablet Take 1 tablet (20 mg total) by mouth daily.   No facility-administered encounter medications on file as of 05/15/2022.    No Known Allergies  Review of Systems  Constitutional:  Negative for activity change, appetite change, chills, fatigue, fever and malaise/fatigue.  HENT: Negative.    Eyes: Negative.  Negative for blurred vision, photophobia and visual disturbance.  Respiratory:  Negative for cough, chest tightness and shortness of breath.   Cardiovascular:  Negative for chest pain, palpitations, orthopnea, leg swelling and PND.  Gastrointestinal:  Negative for abdominal pain, blood in stool, constipation, diarrhea, nausea and vomiting.  Endocrine: Negative.   Genitourinary:  Negative for decreased urine volume, difficulty urinating, dysuria, frequency and urgency.  Musculoskeletal:  Negative for arthralgias, myalgias and neck pain.  Skin: Negative.   Allergic/Immunologic: Negative.   Neurological:  Negative for dizziness, weakness and headaches.  Hematological: Negative.   Psychiatric/Behavioral:  Negative for confusion, hallucinations, sleep disturbance and suicidal ideas.   All other systems reviewed and are negative.       Objective:  BP 121/71   Pulse 69   Temp 98.8 F (37.1 C)   Ht $R'5\' 4"'Ke$  (1.626 m)   Wt 180 lb (81.6  kg)   SpO2 95%   BMI 30.90 kg/m    Wt Readings from Last 3 Encounters:  05/15/22 180 lb (81.6 kg)  04/21/22 179 lb 4 oz (81.3 kg)  04/03/22 180 lb (81.6 kg)    Physical Exam Vitals and nursing note reviewed.  Constitutional:      General: She is not in acute distress.    Appearance: Normal appearance. She is well-developed and well-groomed. She is not ill-appearing, toxic-appearing or diaphoretic.  HENT:     Head: Normocephalic and atraumatic.     Jaw: There is normal jaw occlusion.     Right Ear: Hearing normal.     Left Ear: Hearing normal.     Nose: Nose normal.     Mouth/Throat:     Lips: Pink.     Mouth: Mucous membranes are moist.     Pharynx: Uvula midline.  Eyes:     General: Lids are normal.  Pupils: Pupils are equal, round, and reactive to light.  Neck:     Thyroid: No thyroid mass, thyromegaly or thyroid tenderness.     Vascular: No JVD.     Trachea: Trachea and phonation normal.  Cardiovascular:     Rate and Rhythm: Normal rate and regular rhythm.     Chest Wall: PMI is not displaced.     Pulses: Normal pulses.     Heart sounds: Normal heart sounds. No murmur heard.    No friction rub. No gallop.  Pulmonary:     Effort: Pulmonary effort is normal. No respiratory distress.     Breath sounds: Normal breath sounds. No wheezing.  Abdominal:     General: There is no abdominal bruit.     Palpations: There is no hepatomegaly or splenomegaly.  Musculoskeletal:     Cervical back: Normal range of motion and neck supple.     Right lower leg: No edema.     Left lower leg: No edema.  Skin:    General: Skin is warm and dry.     Capillary Refill: Capillary refill takes less than 2 seconds.     Coloration: Skin is not cyanotic.  Neurological:     General: No focal deficit present.     Mental Status: She is alert and oriented to person, place, and time.     Sensory: Sensation is intact.     Motor: Motor function is intact.     Coordination: Coordination is  intact.     Gait: Gait is intact.     Deep Tendon Reflexes: Reflexes are normal and symmetric.  Psychiatric:        Attention and Perception: Attention and perception normal.        Mood and Affect: Mood and affect normal.        Speech: Speech normal.        Behavior: Behavior normal. Behavior is cooperative.        Thought Content: Thought content normal.        Cognition and Memory: Cognition and memory normal.        Judgment: Judgment normal.     Results for orders placed or performed in visit on 03/07/21  CMP14+EGFR  Result Value Ref Range   Glucose 89 65 - 99 mg/dL   BUN 25 8 - 27 mg/dL   Creatinine, Ser 0.82 0.57 - 1.00 mg/dL   eGFR 78 >59 mL/min/1.73   BUN/Creatinine Ratio 30 (H) 12 - 28   Sodium 143 134 - 144 mmol/L   Potassium 3.5 3.5 - 5.2 mmol/L   Chloride 102 96 - 106 mmol/L   CO2 28 20 - 29 mmol/L   Calcium 9.3 8.7 - 10.3 mg/dL   Total Protein 6.6 6.0 - 8.5 g/dL   Albumin 4.6 3.8 - 4.8 g/dL   Globulin, Total 2.0 1.5 - 4.5 g/dL   Albumin/Globulin Ratio 2.3 (H) 1.2 - 2.2   Bilirubin Total 0.6 0.0 - 1.2 mg/dL   Alkaline Phosphatase 86 44 - 121 IU/L   AST 18 0 - 40 IU/L   ALT 16 0 - 32 IU/L  CBC with Differential/Platelet  Result Value Ref Range   WBC 6.6 3.4 - 10.8 x10E3/uL   RBC 4.61 3.77 - 5.28 x10E6/uL   Hemoglobin 13.7 11.1 - 15.9 g/dL   Hematocrit 41.7 34.0 - 46.6 %   MCV 91 79 - 97 fL   MCH 29.7 26.6 - 33.0 pg   MCHC 32.9 31.5 - 35.7  g/dL   RDW 13.2 11.7 - 15.4 %   Platelets 260 150 - 450 x10E3/uL   Neutrophils 62 Not Estab. %   Lymphs 25 Not Estab. %   Monocytes 8 Not Estab. %   Eos 4 Not Estab. %   Basos 1 Not Estab. %   Neutrophils Absolute 4.1 1.4 - 7.0 x10E3/uL   Lymphocytes Absolute 1.6 0.7 - 3.1 x10E3/uL   Monocytes Absolute 0.5 0.1 - 0.9 x10E3/uL   EOS (ABSOLUTE) 0.3 0.0 - 0.4 x10E3/uL   Basophils Absolute 0.0 0.0 - 0.2 x10E3/uL   Immature Granulocytes 0 Not Estab. %   Immature Grans (Abs) 0.0 0.0 - 0.1 x10E3/uL  Lipid panel  Result  Value Ref Range   Cholesterol, Total 189 100 - 199 mg/dL   Triglycerides 139 0 - 149 mg/dL   HDL 54 >39 mg/dL   VLDL Cholesterol Cal 24 5 - 40 mg/dL   LDL Chol Calc (NIH) 111 (H) 0 - 99 mg/dL   Chol/HDL Ratio 3.5 0.0 - 4.4 ratio  TSH  Result Value Ref Range   TSH 1.200 0.450 - 4.500 uIU/mL  VITAMIN D 25 Hydroxy (Vit-D Deficiency, Fractures)  Result Value Ref Range   Vit D, 25-Hydroxy 34.2 30.0 - 100.0 ng/mL       Pertinent labs & imaging results that were available during my care of the patient were reviewed by me and considered in my medical decision making.  Assessment & Plan:  [place] was seen today for hypertension.  Diagnoses and all orders for this visit:  Essential hypertension Low readings at home on 40 mg of lisinopril. Will decrease to 20 mg daily. Continue to monitor and report continued low or high readings. Will recheck BMP today. DASH diet and exercise encouraged.  -     furosemide (LASIX) 20 MG tablet; Take 1 tablet (20 mg total) by mouth daily. -     lisinopril (ZESTRIL) 20 MG tablet; Take 1 tablet (20 mg total) by mouth daily. -     BMP8+EGFR     Continue all other maintenance medications.  Follow up plan: Return in about 3 months (around 08/15/2022), or if symptoms worsen or fail to improve, for BP.   Continue healthy lifestyle choices, including diet (rich in fruits, vegetables, and lean proteins, and low in salt and simple carbohydrates) and exercise (at least 30 minutes of moderate physical activity daily).  Educational handout given for DASH diet  The above assessment and management plan was discussed with the patient. The patient verbalized understanding of and has agreed to the management plan. Patient is aware to call the clinic if they develop any new symptoms or if symptoms persist or worsen. Patient is aware when to return to the clinic for a follow-up visit. Patient educated on when it is appropriate to go to the emergency department.   Monia Pouch, FNP-C [facility] [phone]

## 2022-05-16 LAB — BMP8+EGFR
BUN/Creatinine Ratio: 20 (ref 12–28)
BUN: 18 mg/dL (ref 8–27)
CO2: 27 mmol/L (ref 20–29)
Calcium: 9.5 mg/dL (ref 8.7–10.3)
Chloride: 105 mmol/L (ref 96–106)
Creatinine, Ser: 0.9 mg/dL (ref 0.57–1.00)
Glucose: 97 mg/dL (ref 70–99)
Potassium: 4.5 mmol/L (ref 3.5–5.2)
Sodium: 145 mmol/L — ABNORMAL HIGH (ref 134–144)
eGFR: 70 mL/min/{1.73_m2} (ref >59)

## 2022-06-24 DIAGNOSIS — H903 Sensorineural hearing loss, bilateral: Secondary | ICD-10-CM | POA: Diagnosis not present

## 2022-06-25 ENCOUNTER — Telehealth: Payer: Self-pay | Admitting: Family Medicine

## 2022-06-25 NOTE — Telephone Encounter (Signed)
Patient aware Ozempic #4 pens from patient assistance is ready to be picked up.

## 2022-07-14 ENCOUNTER — Telehealth: Payer: Self-pay | Admitting: Family Medicine

## 2022-07-14 ENCOUNTER — Other Ambulatory Visit: Payer: Self-pay | Admitting: Family Medicine

## 2022-07-14 DIAGNOSIS — E538 Deficiency of other specified B group vitamins: Secondary | ICD-10-CM

## 2022-07-14 DIAGNOSIS — M810 Age-related osteoporosis without current pathological fracture: Secondary | ICD-10-CM

## 2022-07-14 DIAGNOSIS — E559 Vitamin D deficiency, unspecified: Secondary | ICD-10-CM

## 2022-07-14 DIAGNOSIS — E782 Mixed hyperlipidemia: Secondary | ICD-10-CM

## 2022-07-14 DIAGNOSIS — E8881 Metabolic syndrome: Secondary | ICD-10-CM

## 2022-07-14 DIAGNOSIS — I10 Essential (primary) hypertension: Secondary | ICD-10-CM

## 2022-07-14 NOTE — Telephone Encounter (Signed)
Orders In per [name]

## 2022-07-30 ENCOUNTER — Ambulatory Visit (INDEPENDENT_AMBULATORY_CARE_PROVIDER_SITE_OTHER): Payer: Medicare HMO

## 2022-07-30 DIAGNOSIS — M810 Age-related osteoporosis without current pathological fracture: Secondary | ICD-10-CM

## 2022-07-31 DIAGNOSIS — Z780 Asymptomatic menopausal state: Secondary | ICD-10-CM | POA: Diagnosis not present

## 2022-07-31 DIAGNOSIS — M8589 Other specified disorders of bone density and structure, multiple sites: Secondary | ICD-10-CM | POA: Diagnosis not present

## 2022-08-17 ENCOUNTER — Other Ambulatory Visit: Payer: Medicare HMO

## 2022-08-17 DIAGNOSIS — E538 Deficiency of other specified B group vitamins: Secondary | ICD-10-CM

## 2022-08-17 DIAGNOSIS — E559 Vitamin D deficiency, unspecified: Secondary | ICD-10-CM

## 2022-08-17 DIAGNOSIS — M810 Age-related osteoporosis without current pathological fracture: Secondary | ICD-10-CM

## 2022-08-17 DIAGNOSIS — E8881 Metabolic syndrome: Secondary | ICD-10-CM

## 2022-08-17 DIAGNOSIS — I10 Essential (primary) hypertension: Secondary | ICD-10-CM | POA: Diagnosis not present

## 2022-08-17 DIAGNOSIS — E782 Mixed hyperlipidemia: Secondary | ICD-10-CM | POA: Diagnosis not present

## 2022-08-17 DIAGNOSIS — R739 Hyperglycemia, unspecified: Secondary | ICD-10-CM | POA: Diagnosis not present

## 2022-08-17 LAB — BAYER DCA HB A1C WAIVED: HB A1C (BAYER DCA - WAIVED): 5.3 % (ref 4.8–5.6)

## 2022-08-18 LAB — CBC WITH DIFFERENTIAL/PLATELET
Basophils Absolute: 0 10*3/uL (ref 0.0–0.2)
Basos: 1 %
EOS (ABSOLUTE): 0.3 10*3/uL (ref 0.0–0.4)
Eos: 6 %
Hematocrit: 37.7 % (ref 34.0–46.6)
Hemoglobin: 12 g/dL (ref 11.1–15.9)
Immature Grans (Abs): 0 10*3/uL (ref 0.0–0.1)
Immature Granulocytes: 0 %
Lymphocytes Absolute: 1.4 10*3/uL (ref 0.7–3.1)
Lymphs: 25 %
MCH: 27.6 pg (ref 26.6–33.0)
MCHC: 31.8 g/dL (ref 31.5–35.7)
MCV: 87 fL (ref 79–97)
Monocytes Absolute: 0.4 10*3/uL (ref 0.1–0.9)
Monocytes: 8 %
Neutrophils Absolute: 3.3 10*3/uL (ref 1.4–7.0)
Neutrophils: 60 %
Platelets: 242 10*3/uL (ref 150–450)
RBC: 4.34 x10E6/uL (ref 3.77–5.28)
RDW: 13.5 % (ref 11.7–15.4)
WBC: 5.5 10*3/uL (ref 3.4–10.8)

## 2022-08-18 LAB — CMP14+EGFR
ALT: 14 IU/L (ref 0–32)
AST: 16 IU/L (ref 0–40)
Albumin/Globulin Ratio: 2 (ref 1.2–2.2)
Albumin: 4.2 g/dL (ref 3.9–4.9)
Alkaline Phosphatase: 74 IU/L (ref 44–121)
BUN/Creatinine Ratio: 22 (ref 12–28)
BUN: 16 mg/dL (ref 8–27)
Bilirubin Total: 0.3 mg/dL (ref 0.0–1.2)
CO2: 25 mmol/L (ref 20–29)
Calcium: 9 mg/dL (ref 8.7–10.3)
Chloride: 109 mmol/L — ABNORMAL HIGH (ref 96–106)
Creatinine, Ser: 0.74 mg/dL (ref 0.57–1.00)
Globulin, Total: 2.1 g/dL (ref 1.5–4.5)
Glucose: 80 mg/dL (ref 70–99)
Potassium: 4.1 mmol/L (ref 3.5–5.2)
Sodium: 146 mmol/L — ABNORMAL HIGH (ref 134–144)
Total Protein: 6.3 g/dL (ref 6.0–8.5)
eGFR: 88 mL/min/{1.73_m2} (ref >59)

## 2022-08-18 LAB — VITAMIN B12: Vitamin B-12: 474 pg/mL (ref 232–1245)

## 2022-08-18 LAB — LIPID PANEL
Chol/HDL Ratio: 3 ratio (ref 0.0–4.4)
Cholesterol, Total: 158 mg/dL (ref 100–199)
HDL: 53 mg/dL (ref >39)
LDL Chol Calc (NIH): 91 mg/dL (ref 0–99)
Triglycerides: 75 mg/dL (ref 0–149)
VLDL Cholesterol Cal: 14 mg/dL (ref 5–40)

## 2022-08-18 LAB — THYROID PANEL WITH TSH
Free Thyroxine Index: 1.7 (ref 1.2–4.9)
T3 Uptake Ratio: 27 % (ref 24–39)
T4, Total: 6.3 ug/dL (ref 4.5–12.0)
TSH: 1.5 u[IU]/mL (ref 0.450–4.500)

## 2022-08-18 LAB — VITAMIN D 25 HYDROXY (VIT D DEFICIENCY, FRACTURES): Vit D, 25-Hydroxy: 34 ng/mL (ref 30.0–100.0)

## 2022-08-20 ENCOUNTER — Encounter: Payer: Self-pay | Admitting: Family Medicine

## 2022-08-20 ENCOUNTER — Ambulatory Visit (INDEPENDENT_AMBULATORY_CARE_PROVIDER_SITE_OTHER): Payer: Medicare HMO | Admitting: Family Medicine

## 2022-08-20 VITALS — BP 115/69 | HR 62 | Temp 97.2°F | Ht 64.0 in | Wt 179.6 lb

## 2022-08-20 DIAGNOSIS — E782 Mixed hyperlipidemia: Secondary | ICD-10-CM | POA: Insufficient documentation

## 2022-08-20 DIAGNOSIS — I10 Essential (primary) hypertension: Secondary | ICD-10-CM | POA: Diagnosis not present

## 2022-08-20 DIAGNOSIS — M810 Age-related osteoporosis without current pathological fracture: Secondary | ICD-10-CM | POA: Diagnosis not present

## 2022-08-20 DIAGNOSIS — Z23 Encounter for immunization: Secondary | ICD-10-CM | POA: Diagnosis not present

## 2022-08-20 DIAGNOSIS — Z0001 Encounter for general adult medical examination with abnormal findings: Secondary | ICD-10-CM

## 2022-08-20 DIAGNOSIS — Z0000 Encounter for general adult medical examination without abnormal findings: Secondary | ICD-10-CM

## 2022-08-20 DIAGNOSIS — R82998 Other abnormal findings in urine: Secondary | ICD-10-CM

## 2022-08-20 DIAGNOSIS — E559 Vitamin D deficiency, unspecified: Secondary | ICD-10-CM

## 2022-08-20 DIAGNOSIS — E538 Deficiency of other specified B group vitamins: Secondary | ICD-10-CM

## 2022-08-20 LAB — URINALYSIS, ROUTINE W REFLEX MICROSCOPIC
Bilirubin, UA: NEGATIVE
Glucose, UA: NEGATIVE
Ketones, UA: NEGATIVE
Leukocytes,UA: NEGATIVE
Nitrite, UA: NEGATIVE
Protein,UA: NEGATIVE
RBC, UA: NEGATIVE
Specific Gravity, UA: 1.01 (ref 1.005–1.030)
Urobilinogen, Ur: 0.2 mg/dL (ref 0.2–1.0)
pH, UA: 5 (ref 5.0–7.5)

## 2022-08-20 NOTE — Progress Notes (Signed)
Krista Gonzales is a 71 y.o. female presents to office today for annual physical exam examination.    1. Annual physical exam Reports doing fairly well. Complaint with medications. Denies excessive salt intake. She is currently on once weekly Vit D repletion therapy. Denies arthralgias, trouble walking, or recent fractures. She reports her blood pressure is well controlled. Denies headaches, confusion, weakness, visual changes, leg swelling, palpitations or shortness of breath. She does report a brief episode of chest burning several days ago, only lasted for a few minutes. States radiated across center of chest but did not radiate elsewhere. She denies other associated symptoms. Nothing seemed to cause or relieve the pain. Has elevated cholesterol which is diet controlled. She has Vit B12 deficiency and is on over the counter repletion therapy. No reported fatigue, paresthesias, or weakness. She is concerned about the color of her urine, states it is dark in color. Denies other symptoms.   Marital status: married Substance use: denies Diet: generally healthy Exercise: active daily Last eye exam: yearly Last dental exam: less than a year ago Last colonoscopy: 02/11/2022 Last mammogram: 05/07/2022 Last pap smear: 2019 Refills needed today: none Immunizations needed:  Flu Vaccine: yes  Tdap Vaccine: no  Zoster Vaccine: yes, will get at next visit Pneumonia Vaccine: no  Past Medical History:  Diagnosis Date   Hypertension    Social History   Socioeconomic History   Marital status: Married    Spouse name: [name]   Number of children: 2   Years of education: 12   Highest education level: 12th grade  Occupational History   Occupation: Retired  Tobacco Use   Smoking status: Never   Smokeless tobacco: Never  Substance and Sexual Activity   Alcohol use: No   Drug use: No   Sexual activity: Not Currently  Other Topics Concern   Not on file  Social History Narrative   Lives  in one level home with her husband.   Family/ children live nearby   Social Determinants of Health   Financial Resource Strain: Low Risk  (08/29/2021)   Overall Financial Resource Strain (CARDIA)    Difficulty of Paying Living Expenses: Not hard at all  Food Insecurity: No Food Insecurity (08/29/2021)   Hunger Vital Sign    Worried About Running Out of Food in the Last Year: Never true    Ran Out of Food in the Last Year: Never true  Transportation Needs: No Transportation Needs (08/29/2021)   PRAPARE - Hydrologist (Medical): No    Lack of Transportation (Non-Medical): No  Physical Activity: Sufficiently Active (08/29/2021)   Exercise Vital Sign    Days of Exercise per Week: 5 days    Minutes of Exercise per Session: 30 min  Stress: No Stress Concern Present (08/29/2021)   [place]    Feeling of Stress : Not at all  Social Connections: [place] (08/29/2021)   Social Connection and Isolation Panel [NHANES]    Frequency of Communication with Friends and Family: More than three times a week    Frequency of Social Gatherings with Friends and Family: More than three times a week    Attends Religious Services: More than 4 times per year    Active Member of [employer] or Organizations: Yes    Attends Archivist Meetings: 1 to 4 times per year    Marital Status: Married  Human resources officer Violence: Not At Risk (08/29/2021)  Humiliation, Afraid, Rape, and Kick questionnaire    Fear of Current or Ex-Partner: No    Emotionally Abused: No    Physically Abused: No    Sexually Abused: No   Past Surgical History:  Procedure Laterality Date   ABDOMINAL HYSTERECTOMY     BREAST EXCISIONAL BIOPSY Right    GALLBLADDER SURGERY     HERNIA REPAIR     TUMOR REMOVAL     History reviewed. No pertinent family history.  Current Outpatient Medications:    furosemide (LASIX) 20 MG  tablet, Take 1 tablet (20 mg total) by mouth daily., Disp: 90 tablet, Rfl: 3   lisinopril (ZESTRIL) 20 MG tablet, Take 1 tablet (20 mg total) by mouth daily., Disp: 90 tablet, Rfl: 3   Multiple Vitamins-Minerals (MULTIVITAMIN WITH MINERALS) tablet, Take 1 tablet by mouth daily. , Disp: , Rfl:    polyethylene glycol (MIRALAX / GLYCOLAX) 17 g packet, Take 17 g by mouth daily., Disp: 90 each, Rfl: 3   Semaglutide, 1 MG/DOSE, (OZEMPIC, 1 MG/DOSE,) 4 MG/3ML SOPN, Inject 1 mg into the skin once a week., Disp: 9 mL, Rfl: 3  No Known Allergies   Review of Systems Constitutional: negative Eyes: negative Ears, nose, mouth, throat, and face: negative Respiratory: negative Cardiovascular: brief episode of burning chest pain Gastrointestinal: negative Genitourinary:negative, dark colored urine Integument/breast: negative Hematologic/lymphatic: negative Musculoskeletal:negative, negative Neurological: negative Behavioral/Psych: negative Endocrine: negative Allergic/Immunologic: negative    Physical exam BP 115/69   Pulse 62   Temp (!) 97.2 F (36.2 C) (Temporal)   Ht '5\' 4"'$  (1.626 m)   Wt 179 lb 9.6 oz (81.5 kg)   SpO2 97%   BMI 30.83 kg/m  General appearance: alert, cooperative, appears stated age, no distress, and mildly obese Head: Normocephalic, without obvious abnormality, atraumatic Eyes: conjunctivae/corneas clear. PERRL, EOM's intact. Fundi benign. Ears: normal TM's and external ear canals both ears Nose: Nares normal. Septum midline. Mucosa normal. No drainage or sinus tenderness. Throat: lips, mucosa, and tongue normal; teeth and gums normal Neck: no adenopathy, no carotid bruit, no JVD, supple, symmetrical, trachea midline, and thyroid not enlarged, symmetric, no tenderness/mass/nodules Back: symmetric, no curvature. ROM normal. No CVA tenderness. Lungs: clear to auscultation bilaterally and normal percussion bilaterally Heart: regular rate and rhythm, S1, S2 normal, no murmur,  click, rub or gallop Abdomen: soft, non-tender; bowel sounds normal; no masses,  no organomegaly Extremities: extremities normal, atraumatic, no cyanosis or edema Pulses: 2+ and symmetric Skin: Skin color, texture, turgor normal. No rashes or lesions Lymph nodes: Cervical, supraclavicular, and axillary nodes normal. Neurologic: Alert and oriented X 3, normal strength and tone. Normal symmetric reflexes. Normal coordination and gait    Assessment/ Plan: Cynthia was seen today for annual exam.  Diagnoses and all orders for this visit:  Annual physical exam Labs completed earlier in the week, sodium minimally elevated, all other labs normal. Health maintenance discussed in detail. Discussed indications for cervical cancer screening and pt agrees to not screen today. EKG in office unremarkable. Diet and exercise encouraged.  -     EKG 12-Lead  Vitamin D deficiency Vit d level normal. Can start daily over the counter repletion therapy.   Essential hypertension EKG in office: SR 61, incomplete BBB, no acute ST-T changes, no ectopy, PR 160 ms, QT 416 ms, no changes from prior EKGs. Monia Pouch, FNP-C -     EKG 12-Lead  Mixed hyperlipidemia Diet controlled. Lipid panel normal. Continue with diet and exercise.   B12 deficiency On oral  repletion therapy. Labs normal   Age-related osteoporosis without current pathological fracture DEXA due next year. Does take over the counter calcium and Vit D repletion therapy. No recent fractures.   Dark urine Urinalysis in office unremarkable. Aware to increase water intake.  -     Urinalysis, Routine w reflex microscopic  Need for immunization against influenza -     Flu Vaccine QUAD High Dose(Fluad)      Counseled on healthy lifestyle choices, including diet (rich in fruits, vegetables and lean meats and low in salt and simple carbohydrates) and exercise (at least 30 minutes of moderate physical activity daily).  Patient to follow up in 1  year for annual exam or sooner if needed.  The above assessment and management plan was discussed with the patient. The patient verbalized understanding of and has agreed to the management plan. Patient is aware to call the clinic if symptoms persist or worsen. Patient is aware when to return to the clinic for a follow-up visit. Patient educated on when it is appropriate to go to the emergency department.   Monia Pouch, FNP-C [facility] [address] [place], [place] [address] [phone]

## 2022-09-10 ENCOUNTER — Telehealth: Payer: Self-pay

## 2022-09-10 DIAGNOSIS — H903 Sensorineural hearing loss, bilateral: Secondary | ICD-10-CM | POA: Diagnosis not present

## 2022-09-10 DIAGNOSIS — R42 Dizziness and giddiness: Secondary | ICD-10-CM | POA: Diagnosis not present

## 2022-09-10 DIAGNOSIS — H9313 Tinnitus, bilateral: Secondary | ICD-10-CM | POA: Diagnosis not present

## 2022-09-10 NOTE — Telephone Encounter (Signed)
Mailed Novo Nordisk renewal application to patient home.   Robyn Nohr L. CPhT Rx Patient Advocate  

## 2022-09-18 DIAGNOSIS — R42 Dizziness and giddiness: Secondary | ICD-10-CM | POA: Diagnosis not present

## 2022-09-18 DIAGNOSIS — H905 Unspecified sensorineural hearing loss: Secondary | ICD-10-CM | POA: Diagnosis not present

## 2022-10-16 DIAGNOSIS — R42 Dizziness and giddiness: Secondary | ICD-10-CM | POA: Diagnosis not present

## 2023-01-27 ENCOUNTER — Telehealth: Payer: Self-pay | Admitting: Family Medicine

## 2023-01-27 NOTE — Telephone Encounter (Signed)
Contacted [place] to schedule their annual wellness visit. Appointment made for 02/10/23.  Thank you,  Colletta [place],  [place] [facility] Direct Dial ??[phone]

## 2023-02-10 ENCOUNTER — Ambulatory Visit (INDEPENDENT_AMBULATORY_CARE_PROVIDER_SITE_OTHER): Payer: Medicare HMO

## 2023-02-10 VITALS — Ht 66.0 in | Wt 179.0 lb

## 2023-02-10 DIAGNOSIS — Z0000 Encounter for general adult medical examination without abnormal findings: Secondary | ICD-10-CM

## 2023-02-10 NOTE — Patient Instructions (Signed)
Ms. Cozzens , Thank you for taking time to come for your Medicare Wellness Visit. I appreciate your ongoing commitment to your health goals. Please review the following plan we discussed and let me know if I can assist you in the future.   These are the goals we discussed:  Goals       Have 3 meals a day      02/20/2020 AWV Goal: Improved Nutrition/Diet  Patient will verbalize understanding that diet plays an important role in overall health and that a poor diet is a risk factor for many chronic medical conditions.  Over the next year, patient will improve self management of their diet by incorporating improved meal pattern, less frequent dining out, more consistent meal timing, increased physical activity, improved protein intake, better food choices, and eat 6 small meals per day . Patient will utilize available community resources to help with food acquisition if needed (ex: food pantries, [address], etc) Patient will work with nutrition specialist if a referral was made       Metabolic Syndrome/Obesity (pt-stated)      Current Barriers:  Unable to independently afford treatment regimen Unable to achieve control of metabolic syndrome   Pharmacist Clinical Goal(s):  Over the next 90 days, patient will verbalize ability to afford treatment regimen achieve control of metabolic syndrome as evidenced by decreased weight, improved lipid panel, improved quality of life through collaboration with PharmD and provider.   Interventions: 1:1 collaboration with Sharion Balloon, FNP regarding development and update of comprehensive plan of care as evidenced by provider attestation and co-signature Inter-disciplinary care team collaboration (see longitudinal plan of care) Comprehensive medication review performed; medication list updated in electronic medical record  Overweight/Obesity Complicated by hyperlipidemia: Unable to achieve goal weight loss through lifestyle modification alone; current  treatment; n/a --> restarting Ozempic Medications/Strategies previously tried: ozempic Tolerating ozempic 0.25mg  well--previous notes: had great results, however began to have GI issues.  She now thinks GI issues were unrelated to Ozempic CONTINUE 0.5mg  sq weekly for 4 weeks, then increase to 1mg  sq weekly for 4 weeks.  Will titrate as tolerated Denies personal and family history of Medullary thyroid cancer (MTC) ENROLLED IN [facility] PATIENT ASSISTANCE PROGRAM UNTIL 11/08/21--NEED TO RE ENROLL, PATIENT TO BRING FORMS IN November We [place] to benefits of ozempic/weight loss in lipid panel Baseline weight: 192lbs; most recent weight: 192 lbs Current meal patterns: has difficulty snacking/carbs Current exercise: active/works on projects outside, etc Extensive dietary counseling including education on focus on lean proteins, fruits and vegetables, whole grains and increased fiber consumption, adequate hydration Extensive exercise counseling including eventual goal of 150 minutes of moderate intensity exercise weekly Recommended restart ozempic Assessed patient finances. [facility] rec'd the refills 07/31/21 but at the time ozempic was on backorder. they were just told last week that the medication is back in & orders are being shipped. This pt has 5 boxes that are set to ship, but no exact delivery date   Lipid Panel     Component Value Date/Time   CHOL 189 03/07/2021 1507   TRIG 139 03/07/2021 1507   TRIG 105 05/02/2014 1655   HDL 54 03/07/2021 1507   HDL 57 05/02/2014 1655   CHOLHDL 3.5 03/07/2021 1507   LDLCALC 111 (H) 03/07/2021 1507   LDLCALC 100 (H) 05/02/2014 1655   LABVLDL 24 03/07/2021 1507   Patient Goals/Self-Care Activities Over the next 90 days, patient will:  - take medications as prescribed collaborate with provider on medication access  solutions target a minimum of 150 minutes of moderate intensity exercise weekly engage in dietary modifications by following healthy plate  method   Follow Up Plan: Telephone follow up appointment with care management team member scheduled for: 6 weeks         This is a list of the screening recommended for you and due dates:  Health Maintenance  Topic Date Due   Zoster (Shingles) Vaccine (1 of 2) Never done   Mammogram  05/08/2023   Flu Shot  06/10/2023   Medicare Annual Wellness Visit  02/10/2024   DEXA scan (bone density measurement)  07/31/2024   DTaP/Tdap/Td vaccine (3 - Td or Tdap) 06/26/2030   Colon Cancer Screening  03/03/2032   Pneumonia Vaccine  Completed   Hepatitis C Screening: USPSTF Recommendation to screen - Ages 12-79 yo.  Completed   HPV Vaccine  Aged Out   COVID-19 Vaccine  Discontinued    Advanced directives: Advance directive discussed with you today. I have provided a copy for you to complete at home and have notarized. Once this is complete please bring a copy in to our office so we can scan it into your chart.   Conditions/risks identified: Aim for 30 minutes of exercise or brisk walking, 6-8 glasses of water, and 5 servings of fruits and vegetables each day.   Next appointment: Follow up in one year for your annual wellness visit    Preventive Care 65 Years and Older, Female Preventive care refers to lifestyle choices and visits with your health care provider that can promote health and wellness. What does preventive care include? A yearly physical exam. This is also called an annual well check. Dental exams once or twice a year. Routine eye exams. Ask your health care provider how often you should have your eyes checked. Personal lifestyle choices, including: Daily care of your teeth and gums. Regular physical activity. Eating a healthy diet. Avoiding tobacco and drug use. Limiting alcohol use. Practicing safe sex. Taking low-dose aspirin every day. Taking vitamin and mineral supplements as recommended by your health care provider. What happens during an annual well check? The  services and screenings done by your health care provider during your annual well check will depend on your age, overall health, lifestyle risk factors, and family history of disease. Counseling  Your health care provider may ask you questions about your: Alcohol use. Tobacco use. Drug use. Emotional well-being. Home and relationship well-being. Sexual activity. Eating habits. History of falls. Memory and ability to understand (cognition). Work and work Statistician. Reproductive health. Screening  You may have the following tests or measurements: Height, weight, and BMI. Blood pressure. Lipid and cholesterol levels. These may be checked every 5 years, or more frequently if you are over 4 years old. Skin check. Lung cancer screening. You may have this screening every year starting at age 2 if you have a 30-pack-year history of smoking and currently smoke or have quit within the past 15 years. Fecal occult blood test (FOBT) of the stool. You may have this test every year starting at age 55. Flexible sigmoidoscopy or colonoscopy. You may have a sigmoidoscopy every 5 years or a colonoscopy every 10 years starting at age 6. Hepatitis C blood test. Hepatitis B blood test. Sexually transmitted disease (STD) testing. Diabetes screening. This is done by checking your blood sugar (glucose) after you have not eaten for a while (fasting). You may have this done every 1-3 years. Bone density scan. This is done to screen  for osteoporosis. You may have this done starting at age 68. Mammogram. This may be done every 1-2 years. Talk to your health care provider about how often you should have regular mammograms. Talk with your health care provider about your test results, treatment options, and if necessary, the need for more tests. Vaccines  Your health care provider may recommend certain vaccines, such as: Influenza vaccine. This is recommended every year. Tetanus, diphtheria, and acellular  pertussis (Tdap, Td) vaccine. You may need a Td booster every 10 years. Zoster vaccine. You may need this after age 29. Pneumococcal 13-valent conjugate (PCV13) vaccine. One dose is recommended after age 59. Pneumococcal polysaccharide (PPSV23) vaccine. One dose is recommended after age 64. Talk to your health care provider about which screenings and vaccines you need and how often you need them. This information is not intended to replace advice given to you by your health care provider. Make sure you discuss any questions you have with your health care provider. Document Released: 11/22/2015 Document Revised: 07/15/2016 Document Reviewed: 08/27/2015 Elsevier Interactive Patient Education  2017 [employer].  Fall Prevention in the Home Falls can cause injuries. They can happen to people of all ages. There are many things you can do to make your home safe and to help prevent falls. What can I do on the outside of my home? Regularly fix the edges of walkways and driveways and fix any cracks. Remove anything that might make you trip as you walk through a door, such as a raised step or threshold. Trim any bushes or trees on the path to your home. Use bright outdoor lighting. Clear any walking paths of anything that might make someone trip, such as rocks or tools. Regularly check to see if handrails are loose or broken. Make sure that both sides of any steps have handrails. Any raised decks and porches should have guardrails on the edges. Have any leaves, snow, or ice cleared regularly. Use sand or salt on walking paths during winter. Clean up any spills in your garage right away. This includes oil or grease spills. What can I do in the bathroom? Use night lights. Install grab bars by the toilet and in the tub and shower. Do not use towel bars as grab bars. Use non-skid mats or decals in the tub or shower. If you need to sit down in the shower, use a plastic, non-slip stool. Keep the floor  dry. Clean up any water that spills on the floor as soon as it happens. Remove soap buildup in the tub or shower regularly. Attach bath mats securely with double-sided non-slip rug tape. Do not have throw rugs and other things on the floor that can make you trip. What can I do in the bedroom? Use night lights. Make sure that you have a light by your bed that is easy to reach. Do not use any sheets or blankets that are too big for your bed. They should not hang down onto the floor. Have a firm chair that has side arms. You can use this for support while you get dressed. Do not have throw rugs and other things on the floor that can make you trip. What can I do in the kitchen? Clean up any spills right away. Avoid walking on wet floors. Keep items that you use a lot in easy-to-reach places. If you need to reach something above you, use a strong step stool that has a grab bar. Keep electrical cords out of the way. Do  not use floor polish or wax that makes floors slippery. If you must use wax, use non-skid floor wax. Do not have throw rugs and other things on the floor that can make you trip. What can I do with my stairs? Do not leave any items on the stairs. Make sure that there are handrails on both sides of the stairs and use them. Fix handrails that are broken or loose. Make sure that handrails are as long as the stairways. Check any carpeting to make sure that it is firmly attached to the stairs. Fix any carpet that is loose or worn. Avoid having throw rugs at the top or bottom of the stairs. If you do have throw rugs, attach them to the floor with carpet tape. Make sure that you have a light switch at the top of the stairs and the bottom of the stairs. If you do not have them, ask someone to add them for you. What else can I do to help prevent falls? Wear shoes that: Do not have high heels. Have rubber bottoms. Are comfortable and fit you well. Are closed at the toe. Do not wear  sandals. If you use a stepladder: Make sure that it is fully opened. Do not climb a closed stepladder. Make sure that both sides of the stepladder are locked into place. Ask someone to hold it for you, if possible. Clearly mark and make sure that you can see: Any grab bars or handrails. First and last steps. Where the edge of each step is. Use tools that help you move around (mobility aids) if they are needed. These include: Canes. Walkers. Scooters. Crutches. Turn on the lights when you go into a dark area. Replace any light bulbs as soon as they burn out. Set up your furniture so you have a clear path. Avoid moving your furniture around. If any of your floors are uneven, fix them. If there are any pets around you, be aware of where they are. Review your medicines with your doctor. Some medicines can make you feel dizzy. This can increase your chance of falling. Ask your doctor what other things that you can do to help prevent falls. This information is not intended to replace advice given to you by your health care provider. Make sure you discuss any questions you have with your health care provider. Document Released: 08/22/2009 Document Revised: 04/02/2016 Document Reviewed: 11/30/2014 Elsevier Interactive Patient Education  2017 [employer].

## 2023-02-10 NOTE — Progress Notes (Signed)
Subjective:   Krista Gonzales is a 72 y.o. female who presents for Medicare Annual (Subsequent) preventive examination. I connected with  [name] on 02/10/23 by a audio enabled telemedicine application and verified that I am speaking with the correct person using two identifiers.  Patient Location: Home  Provider Location: Home Office  I discussed the limitations of evaluation and management by telemedicine. The patient expressed understanding and agreed to proceed.  Review of Systems     Cardiac Risk Factors include: advanced age (>9men, >28 women);dyslipidemia     Objective:    Today's Vitals   02/10/23 1533  Weight: 179 lb (81.2 kg)  Height: 5\' 6"  (1.676 m)   Body mass index is 28.89 kg/m.     02/10/2023    3:36 PM 08/29/2021    4:11 PM 02/20/2020   10:37 AM  Advanced Directives  Does Patient Have a Medical Advance Directive? No No No  Would patient like information on creating a medical advance directive? No - Patient declined No - Patient declined Yes (MAU/Ambulatory/Procedural Areas - Information given)    Current Medications (verified) Outpatient Encounter Medications as of 02/10/2023  Medication Sig   furosemide (LASIX) 20 MG tablet Take 1 tablet (20 mg total) by mouth daily.   lisinopril (ZESTRIL) 20 MG tablet Take 1 tablet (20 mg total) by mouth daily.   Multiple Vitamins-Minerals (MULTIVITAMIN WITH MINERALS) tablet Take 1 tablet by mouth daily.    polyethylene glycol (MIRALAX / GLYCOLAX) 17 g packet Take 17 g by mouth daily.   Semaglutide, 1 MG/DOSE, (OZEMPIC, 1 MG/DOSE,) 4 MG/3ML SOPN Inject 1 mg into the skin once a week. (Patient not taking: Reported on 02/10/2023)   No facility-administered encounter medications on file as of 02/10/2023.    Allergies (verified) Patient has no known allergies.   History: Past Medical History:  Diagnosis Date   Hypertension    Past Surgical History:  Procedure Laterality Date   ABDOMINAL HYSTERECTOMY      BREAST EXCISIONAL BIOPSY Right    GALLBLADDER SURGERY     HERNIA REPAIR     TUMOR REMOVAL     History reviewed. No pertinent family history. Social History   Socioeconomic History   Marital status: Married    Spouse name: [name]   Number of children: 2   Years of education: 12   Highest education level: 12th grade  Occupational History   Occupation: Retired  Tobacco Use   Smoking status: Never   Smokeless tobacco: Never  Substance and Sexual Activity   Alcohol use: No   Drug use: No   Sexual activity: Not Currently  Other Topics Concern   Not on file  Social History Narrative   Lives in one level home with her husband.   Family/ children live nearby   Social Determinants of Health   Financial Resource Strain: Low Risk  (02/10/2023)   Overall Financial Resource Strain (CARDIA)    Difficulty of Paying Living Expenses: Not hard at all  Food Insecurity: No Food Insecurity (02/10/2023)   Hunger Vital Sign    Worried About Running Out of Food in the Last Year: Never true    Ran Out of Food in the Last Year: Never true  Transportation Needs: No Transportation Needs (02/10/2023)   PRAPARE - Hydrologist (Medical): No    Lack of Transportation (Non-Medical): No  Physical Activity: Insufficiently Active (02/10/2023)   Exercise Vital Sign    Days of Exercise per  Week: 3 days    Minutes of Exercise per Session: 30 min  Stress: No Stress Concern Present (02/10/2023)   [place]    Feeling of Stress : Not at all  Social Connections: Moderately Integrated (02/10/2023)   Social Connection and Isolation Panel [NHANES]    Frequency of Communication with Friends and Family: More than three times a week    Frequency of Social Gatherings with Friends and Family: More than three times a week    Attends Religious Services: More than 4 times per year    Active Member of [employer] or Organizations: No     Attends Music therapist: Never    Marital Status: Married    Tobacco Counseling Counseling given: Not Answered   Clinical Intake:  Pre-visit preparation completed: Yes  Pain : No/denies pain     Nutritional Risks: None Diabetes: No  How often do you need to have someone help you when you read instructions, pamphlets, or other written materials from your doctor or pharmacy?: 1 - Never  Diabetic?no   Interpreter Needed?: No  Information entered by :: Jadene Pierini, LPN   Activities of Daily Living    02/10/2023    3:36 PM  In your present state of health, do you have any difficulty performing the following activities:  Hearing? 0  Vision? 0  Difficulty concentrating or making decisions? 0  Walking or climbing stairs? 0  Dressing or bathing? 0  Doing errands, shopping? 0  Preparing Food and eating ? N  Using the Toilet? N  In the past six months, have you accidently leaked urine? N  Do you have problems with loss of bowel control? N  Managing your Medications? N  Managing your Finances? N  Housekeeping or managing your Housekeeping? N    Patient Care Team: [name], FNP as PCP - General (Family Medicine)  Indicate any recent Medical Services you may have received from other than [facility] providers in the past year (date may be approximate).     Assessment:   This is a routine wellness examination for [facility].  Hearing/Vision screen Vision Screening - Comments:: Wears rx glasses - up to date with routine eye exams with  Dr.Harris   Dietary issues and exercise activities discussed: Current Exercise Habits: Home exercise routine, Type of exercise: walking, Time (Minutes): 30, Frequency (Times/Week): 3, Weekly Exercise (Minutes/Week): 90, Intensity: Mild, Exercise limited by: None identified   Goals Addressed             This Visit's Progress    Have 3 meals a day   On track    02/20/2020 AWV Goal: Improved Nutrition/Diet  Patient will  verbalize understanding that diet plays an important role in overall health and that a poor diet is a risk factor for many chronic medical conditions.  Over the next year, patient will improve self management of their diet by incorporating improved meal pattern, less frequent dining out, more consistent meal timing, increased physical activity, improved protein intake, better food choices, and eat 6 small meals per day . Patient will utilize available community resources to help with food acquisition if needed (ex: food pantries, [address], etc) Patient will work with nutrition specialist if a referral was made        Depression Screen    02/10/2023    3:35 PM 08/20/2022    8:54 AM 05/15/2022    9:12 AM 04/21/2022   10:55 AM 04/03/2022  2:48 PM 08/29/2021    4:10 PM 05/02/2021    3:03 PM  PHQ 2/9 Scores  PHQ - 2 Score 0 0 0 0 0 0 0  PHQ- 9 Score  0 3 0 0  5    Fall Risk    02/10/2023    3:34 PM 08/20/2022    8:54 AM 04/21/2022   10:55 AM 04/03/2022    2:47 PM 08/29/2021    4:11 PM  Fall Risk   Falls in the past year? 0 0 0 0 0  Number falls in past yr: 0    0  Injury with Fall? 0    0  Risk for fall due to : No Fall Risks    No Fall Risks  Follow up Falls prevention discussed    Falls prevention discussed    FALL RISK PREVENTION PERTAINING TO THE HOME:  Any stairs in or around the home? No  If so, are there any without handrails? No  Home free of loose throw rugs in walkways, pet beds, electrical cords, etc? Yes  Adequate lighting in your home to reduce risk of falls? Yes   ASSISTIVE DEVICES UTILIZED TO PREVENT FALLS:  Life alert? No  Use of a cane, walker or w/c? No  Grab bars in the bathroom? No  Shower chair or bench in shower? No  Elevated toilet seat or a handicapped toilet? No          02/10/2023    3:36 PM 02/20/2020   10:38 AM  6CIT Screen  What Year? 0 points 0 points  What month? 0 points 0 points  What time? 0 points 0 points  Count back from 20 0 points  0 points  Months in reverse 0 points 0 points  Repeat phrase 0 points 0 points  Total Score 0 points 0 points    Immunizations Immunization History  Administered Date(s) Administered   Fluad Quad(high Dose 65+) 11/01/2019, 09/02/2020, 08/20/2022   Influenza,inj,Quad PF,6+ Mos 11/19/2015, 09/07/2018   Moderna SARS-COV2 Booster Vaccination 11/10/2020   PFIZER(Purple Top)SARS-COV-2 Vaccination 01/10/2020, 01/31/2020   Pneumococcal Conjugate-13 06/08/2019   Pneumococcal Polysaccharide-23 09/02/2020   Tdap 08/22/2013, 06/26/2020    TDAP status: Up to date  Flu Vaccine status: Up to date  Pneumococcal vaccine status: Up to date  Covid-19 vaccine status: Completed vaccines  Qualifies for Shingles Vaccine? Yes   Zostavax completed No   Shingrix Completed?: No.    Education has been provided regarding the importance of this vaccine. Patient has been advised to call insurance company to determine out of pocket expense if they have not yet received this vaccine. Advised may also receive vaccine at local pharmacy or Health Dept. Verbalized acceptance and understanding.  Screening Tests Health Maintenance  Topic Date Due   Zoster Vaccines- Shingrix (1 of 2) Never done   MAMMOGRAM  05/08/2023   INFLUENZA VACCINE  06/10/2023   Medicare Annual Wellness (AWV)  02/10/2024   DEXA SCAN  07/31/2024   DTaP/Tdap/Td (3 - Td or Tdap) 06/26/2030   COLONOSCOPY (Pts 45-28yrs Insurance coverage will need to be confirmed)  03/03/2032   Pneumonia Vaccine 108+ Years old  Completed   Hepatitis C Screening  Completed   HPV VACCINES  Aged Out   COVID-19 Vaccine  Discontinued    Health Maintenance  Health Maintenance Due  Topic Date Due   Zoster Vaccines- Shingrix (1 of 2) Never done    Colorectal cancer screening: Type of screening: Colonoscopy. Completed 03/03/2022. Repeat every 10  years  Mammogram status: Completed 05/07/2022. Repeat every year  Bone Density status: Completed 07/31/2022.  Results reflect: Bone density results: OSTEOPOROSIS. Repeat every 2 years.  Lung Cancer Screening: (Low Dose CT Chest recommended if Age 70-80 years, 30 pack-year currently smoking OR have quit w/in 15years.) does not qualify.   Lung Cancer Screening Referral: n/a  Additional Screening:  Hepatitis C Screening: does not qualify; Completed 11/19/2015  Vision Screening: Recommended annual ophthalmology exams for early detection of glaucoma and other disorders of the eye. Is the patient up to date with their annual eye exam?  Yes  Who is the provider or what is the name of the office in which the patient attends annual eye exams? Dr.harris  If pt is not established with a provider, would they like to be referred to a provider to establish care? No .   Dental Screening: Recommended annual dental exams for proper oral hygiene  Community Resource Referral / Chronic Care Management: CRR required this visit?  No   CCM required this visit?  No      Plan:     I have personally reviewed and noted the following in the patient's chart:   Medical and social history Use of alcohol, tobacco or illicit drugs  Current medications and supplements including opioid prescriptions. Patient is not currently taking opioid prescriptions. Functional ability and status Nutritional status Physical activity Advanced directives List of other physicians Hospitalizations, surgeries, and ER visits in previous 12 months Vitals Screenings to include cognitive, depression, and falls Referrals and appointments  In addition, I have reviewed and discussed with patient certain preventive protocols, quality metrics, and best practice recommendations. A written personalized care plan for preventive services as well as general preventive health recommendations were provided to patient.     Daphane Shepherd, LPN   [phone]   Nurse Notes: none

## 2023-04-24 DIAGNOSIS — I10 Essential (primary) hypertension: Secondary | ICD-10-CM | POA: Diagnosis not present

## 2023-04-24 DIAGNOSIS — J069 Acute upper respiratory infection, unspecified: Secondary | ICD-10-CM | POA: Diagnosis not present

## 2023-05-05 DIAGNOSIS — H524 Presbyopia: Secondary | ICD-10-CM | POA: Diagnosis not present

## 2023-05-05 DIAGNOSIS — Z135 Encounter for screening for eye and ear disorders: Secondary | ICD-10-CM | POA: Diagnosis not present

## 2023-05-05 DIAGNOSIS — H5203 Hypermetropia, bilateral: Secondary | ICD-10-CM | POA: Diagnosis not present

## 2023-05-05 DIAGNOSIS — D3132 Benign neoplasm of left choroid: Secondary | ICD-10-CM | POA: Diagnosis not present

## 2023-05-10 ENCOUNTER — Other Ambulatory Visit: Payer: Self-pay | Admitting: Family Medicine

## 2023-05-10 DIAGNOSIS — I10 Essential (primary) hypertension: Secondary | ICD-10-CM

## 2023-05-21 ENCOUNTER — Other Ambulatory Visit: Payer: Self-pay | Admitting: Family Medicine

## 2023-05-21 DIAGNOSIS — Z1231 Encounter for screening mammogram for malignant neoplasm of breast: Secondary | ICD-10-CM

## 2023-05-25 ENCOUNTER — Ambulatory Visit
Admission: RE | Admit: 2023-05-25 | Discharge: 2023-05-25 | Disposition: A | Discharge status: Home / Self Care | Payer: Medicare HMO | Source: Ambulatory Visit | Attending: Family Medicine | Admitting: Family Medicine

## 2023-05-25 ENCOUNTER — Other Ambulatory Visit: Payer: Self-pay | Admitting: Family Medicine

## 2023-05-25 DIAGNOSIS — Z1231 Encounter for screening mammogram for malignant neoplasm of breast: Secondary | ICD-10-CM | POA: Diagnosis not present

## 2023-05-25 DIAGNOSIS — I10 Essential (primary) hypertension: Secondary | ICD-10-CM

## 2023-05-27 ENCOUNTER — Other Ambulatory Visit: Payer: Self-pay | Admitting: Family Medicine

## 2023-05-27 ENCOUNTER — Ambulatory Visit: Payer: Medicare HMO

## 2023-05-27 DIAGNOSIS — R928 Other abnormal and inconclusive findings on diagnostic imaging of breast: Secondary | ICD-10-CM

## 2023-06-02 ENCOUNTER — Ambulatory Visit: Admission: RE | Admit: 2023-06-02 | Payer: Medicare HMO | Source: Ambulatory Visit

## 2023-06-02 ENCOUNTER — Ambulatory Visit: Payer: Medicare HMO

## 2023-06-02 DIAGNOSIS — R928 Other abnormal and inconclusive findings on diagnostic imaging of breast: Secondary | ICD-10-CM

## 2023-07-28 DIAGNOSIS — H903 Sensorineural hearing loss, bilateral: Secondary | ICD-10-CM | POA: Diagnosis not present

## 2023-08-12 ENCOUNTER — Ambulatory Visit: Payer: Medicare HMO | Admitting: Family Medicine

## 2023-08-12 ENCOUNTER — Encounter: Payer: Self-pay | Admitting: Family Medicine

## 2023-08-12 VITALS — BP 119/68 | HR 68 | Temp 97.6°F | Ht 66.0 in | Wt 186.6 lb

## 2023-08-12 DIAGNOSIS — R1013 Epigastric pain: Secondary | ICD-10-CM | POA: Diagnosis not present

## 2023-08-12 DIAGNOSIS — E8881 Metabolic syndrome: Secondary | ICD-10-CM | POA: Diagnosis not present

## 2023-08-12 DIAGNOSIS — E782 Mixed hyperlipidemia: Secondary | ICD-10-CM | POA: Diagnosis not present

## 2023-08-12 MED ORDER — FAMOTIDINE 20 MG PO TABS: 20.0000 mg | ORAL_TABLET | Freq: Two times a day (BID) | ORAL | 0 refills | Status: DC

## 2023-08-12 MED ORDER — FAMOTIDINE 20 MG PO TABS
20.0000 mg | ORAL_TABLET | Freq: Two times a day (BID) | ORAL | 0 refills | Status: DC
Start: 2023-08-12 — End: 2024-07-21

## 2023-08-12 NOTE — Progress Notes (Signed)
Subjective:  Patient ID: Krista Gonzales, female    DOB: 03-20-1951, 72 y.o.   MRN: [id number]  Patient Care Team: Sonny Masters, FNP as PCP - General (Family Medicine)   Chief Complaint:  Back Pain (Patient states that she has been having on and off back pain and heaviness in chest . )   HPI: [facility] Krista Gonzales is a 72 y.o. female presenting on 08/12/2023 for Back Pain (Patient states that she has been having on and off back pain and heaviness in chest . )   Abdominal Pain This is a new problem. The current episode started in the past 7 days. The problem occurs intermittently. The problem has been waxing and waning. The pain is located in the epigastric region. The pain is mild. The quality of the pain is burning and aching. The abdominal pain radiates to the back. Associated symptoms include belching. Pertinent negatives include no anorexia, arthralgias, constipation, diarrhea, dysuria, fever, flatus, frequency, headaches, hematochezia, hematuria, melena, myalgias, nausea, vomiting or weight loss. Nothing aggravates the pain. Relieved by: [employer]. She has tried nothing for the symptoms. The treatment provided significant relief.  No chest pain, shortness of breath, palpitations, nausea, vomiting, weakness, fatigue, diaphoresis, jaw pain, neck pain, or arm pain. No syncope.    Relevant past medical, surgical, family, and social history reviewed and updated as indicated.  Allergies and medications reviewed and updated. Data reviewed: Chart in Epic.   Past Medical History:  Diagnosis Date   Hypertension     Past Surgical History:  Procedure Laterality Date   ABDOMINAL HYSTERECTOMY     BREAST EXCISIONAL BIOPSY Right    GALLBLADDER SURGERY     HERNIA REPAIR     TUMOR REMOVAL      Social History   Socioeconomic History   Marital status: Married    Spouse name: Dwight   Number of children: 2   Years of education: 12   Highest education level: GED or equivalent   Occupational History   Occupation: Retired  Tobacco Use   Smoking status: Never   Smokeless tobacco: Never  Substance and Sexual Activity   Alcohol use: No   Drug use: No   Sexual activity: Not Currently  Other Topics Concern   Not on file  Social History Narrative   Lives in one level home with her husband.   Family/ children live nearby   Social Determinants of Health   Financial Resource Strain: Medium Risk (08/11/2023)   Overall Financial Resource Strain (CARDIA)    Difficulty of Paying Living Expenses: Somewhat hard  Food Insecurity: No Food Insecurity (08/11/2023)   Hunger Vital Sign    Worried About Running Out of Food in the Last Year: Never true    Ran Out of Food in the Last Year: Never true  Transportation Needs: No Transportation Needs (08/11/2023)   PRAPARE - Administrator, Civil Service (Medical): No    Lack of Transportation (Non-Medical): No  Physical Activity: Insufficiently Active (08/11/2023)   Exercise Vital Sign    Days of Exercise per Week: 2 days    Minutes of Exercise per Session: 20 min  Stress: No Stress Concern Present (08/11/2023)   [employer] of Occupational Health - Occupational Stress Questionnaire    Feeling of Stress : Not at all  Social Connections: Moderately Integrated (08/11/2023)   Social Connection and Isolation Panel [NHANES]    Frequency of Communication with Friends and Family: More than three times a week  Frequency of Social Gatherings with Friends and Family: More than three times a week    Attends Religious Services: 1 to 4 times per year    Active Member of [employer] or Organizations: No    Attends Banker Meetings: Never    Marital Status: Married  Catering manager Violence: Not At Risk (02/10/2023)   Humiliation, Afraid, Rape, and Kick questionnaire    Fear of Current or Ex-Partner: No    Emotionally Abused: No    Physically Abused: No    Sexually Abused: No    Outpatient Encounter Medications  as of 08/12/2023  Medication Sig   furosemide (LASIX) 20 MG tablet TAKE 1 TABLET EVERY DAY   lisinopril (ZESTRIL) 20 MG tablet Take 1 tablet (20 mg total) by mouth daily.   Multiple Vitamins-Minerals (MULTIVITAMIN WITH MINERALS) tablet Take 1 tablet by mouth daily.    polyethylene glycol (MIRALAX / GLYCOLAX) 17 g packet Take 17 g by mouth daily.   [DISCONTINUED] famotidine (PEPCID) 20 MG tablet Take 1 tablet (20 mg total) by mouth 2 (two) times daily.   famotidine (PEPCID) 20 MG tablet Take 1 tablet (20 mg total) by mouth 2 (two) times daily.   [DISCONTINUED] Semaglutide, 1 MG/DOSE, (OZEMPIC, 1 MG/DOSE,) 4 MG/3ML SOPN Inject 1 mg into the skin once a week. (Patient not taking: Reported on 02/10/2023)   No facility-administered encounter medications on file as of 08/12/2023.    No Known Allergies  Review of Systems  Constitutional:  Negative for activity change, appetite change, chills, diaphoresis, fatigue, fever, unexpected weight change and weight loss.  HENT: Negative.    Eyes: Negative.  Negative for photophobia and visual disturbance.  Respiratory:  Negative for apnea, cough, choking, chest tightness, shortness of breath and stridor.   Cardiovascular:  Negative for chest pain, palpitations and leg swelling.  Gastrointestinal:  Positive for abdominal pain. Negative for abdominal distention, anal bleeding, anorexia, blood in stool, constipation, diarrhea, flatus, hematochezia, melena, nausea and vomiting.  Endocrine: Negative.   Genitourinary:  Negative for decreased urine volume, difficulty urinating, dysuria, frequency, hematuria and urgency.  Musculoskeletal:  Negative for arthralgias and myalgias.  Skin: Negative.   Allergic/Immunologic: Negative.   Neurological:  Negative for dizziness, tremors, seizures, syncope, facial asymmetry, speech difficulty, weakness, light-headedness, numbness and headaches.  Hematological: Negative.   Psychiatric/Behavioral:  Negative for confusion,  hallucinations, sleep disturbance and suicidal ideas.   All other systems reviewed and are negative.       Objective:  BP 119/68   Pulse 68   Temp 97.6 F (36.4 C) (Temporal)   Ht 5\' 6"  (1.676 m)   Wt 186 lb 9.6 oz (84.6 kg)   SpO2 94%   BMI 30.12 kg/m    Wt Readings from Last 3 Encounters:  08/12/23 186 lb 9.6 oz (84.6 kg)  02/10/23 179 lb (81.2 kg)  08/20/22 179 lb 9.6 oz (81.5 kg)    Physical Exam Vitals and nursing note reviewed.  Constitutional:      General: She is not in acute distress.    Appearance: Normal appearance. She is well-developed and well-groomed. She is not ill-appearing, toxic-appearing or diaphoretic.  HENT:     Head: Normocephalic and atraumatic.     Jaw: There is normal jaw occlusion.     Right Ear: Hearing normal.     Left Ear: Hearing normal.     Nose: Nose normal.     Mouth/Throat:     Lips: Pink.     Mouth: Mucous membranes are  moist.     Pharynx: Oropharynx is clear. Uvula midline.  Eyes:     General: Lids are normal.     Extraocular Movements: Extraocular movements intact.     Conjunctiva/sclera: Conjunctivae normal.     Pupils: Pupils are equal, round, and reactive to light.  Neck:     Thyroid: No thyroid mass, thyromegaly or thyroid tenderness.     Vascular: No carotid bruit or JVD.     Trachea: Trachea and phonation normal.  Cardiovascular:     Rate and Rhythm: Normal rate and regular rhythm.     Chest Wall: PMI is not displaced.     Pulses: Normal pulses.     Heart sounds: Normal heart sounds. No murmur heard.    No friction rub. No gallop.  Pulmonary:     Effort: Pulmonary effort is normal. No respiratory distress.     Breath sounds: Normal breath sounds. No wheezing.  Abdominal:     General: Bowel sounds are normal. There is no distension or abdominal bruit.     Palpations: Abdomen is soft. There is no hepatomegaly, splenomegaly or mass.     Tenderness: There is no abdominal tenderness. There is no right CVA tenderness,  left CVA tenderness, guarding or rebound.     Hernia: No hernia is present.  Musculoskeletal:        General: Normal range of motion.     Cervical back: Normal range of motion and neck supple.     Right lower leg: No edema.     Left lower leg: No edema.  Lymphadenopathy:     Cervical: No cervical adenopathy.  Skin:    General: Skin is warm and dry.     Capillary Refill: Capillary refill takes less than 2 seconds.     Coloration: Skin is not cyanotic, jaundiced or pale.     Findings: No rash.  Neurological:     General: No focal deficit present.     Mental Status: She is alert and oriented to person, place, and time.     Sensory: Sensation is intact.     Motor: Motor function is intact.     Coordination: Coordination is intact.     Gait: Gait is intact.     Deep Tendon Reflexes: Reflexes are normal and symmetric.  Psychiatric:        Attention and Perception: Attention and perception normal.        Mood and Affect: Mood and affect normal.        Speech: Speech normal.        Behavior: Behavior normal. Behavior is cooperative.        Thought Content: Thought content normal.        Cognition and Memory: Cognition and memory normal.        Judgment: Judgment normal.     Results for orders placed or performed in visit on 08/20/22  Urinalysis, Routine w reflex microscopic  Result Value Ref Range   Specific Gravity, UA 1.010 1.005 - 1.030   pH, UA 5.0 5.0 - 7.5   Color, UA Yellow Yellow   Appearance Ur Clear Clear   Leukocytes,UA Negative Negative   Protein,UA Negative Negative/Trace   Glucose, UA Negative Negative   Ketones, UA Negative Negative   RBC, UA Negative Negative   Bilirubin, UA Negative Negative   Urobilinogen, Ur 0.2 0.2 - 1.0 mg/dL   Nitrite, UA Negative Negative     EKG: SR 70, BBB, PR 164 ms, QT 390 ms,  no acute ST-T changes. No changes from prior EKG. Kari Baars, FNP-C.   Pertinent labs & imaging results that were available during my care of the  patient were reviewed by me and considered in my medical decision making.  Assessment & Plan:  Roselynne was seen today for back pain.  Diagnoses and all orders for this visit:  Epigastric pain No acute EKG changes or other signs of ACS. Will check below labs for possible underlying causes such as pancreatitis. Will start PPI therpay. Pt aware of red flags which require emergent evaluation and treatment. Follow up in 3-4 weeks for reevaluation, sooner if warranted.  -     EKG 12-Lead -     CBC with Differential/Platelet -     CMP14+EGFR -     Amylase -     Lipid panel -     Thyroid Panel With TSH -     Lipase -     famotidine (PEPCID) 20 MG tablet; Take 1 tablet (20 mg total) by mouth 2 (two) times daily.     Continue all other maintenance medications.  Follow up plan: Return in about 3 weeks (around 09/02/2023), or if symptoms worsen or fail to improve, for epigastric pain.   Continue healthy lifestyle choices, including diet (rich in fruits, vegetables, and lean proteins, and low in salt and simple carbohydrates) and exercise (at least 30 minutes of moderate physical activity daily).   The above assessment and management plan was discussed with the patient. The patient verbalized understanding of and has agreed to the management plan. Patient is aware to call the clinic if they develop any new symptoms or if symptoms persist or worsen. Patient is aware when to return to the clinic for a follow-up visit. Patient educated on when it is appropriate to go to the emergency department.   Kari Baars, FNP-C [facility] [phone]

## 2023-08-13 LAB — CMP14+EGFR
ALT: 15 [IU]/L (ref 0–32)
AST: 19 [IU]/L (ref 0–40)
Albumin: 4.5 g/dL (ref 3.9–4.9)
Alkaline Phosphatase: 92 [IU]/L (ref 44–121)
BUN/Creatinine Ratio: 24 (ref 12–28)
BUN: 18 mg/dL (ref 8–27)
Bilirubin Total: 0.3 mg/dL (ref 0.0–1.2)
CO2: 26 mmol/L (ref 20–29)
Calcium: 9.5 mg/dL (ref 8.7–10.3)
Chloride: 100 mmol/L (ref 96–106)
Creatinine, Ser: 0.74 mg/dL (ref 0.57–1.00)
Globulin, Total: 2.3 g/dL (ref 1.5–4.5)
Glucose: 89 mg/dL (ref 70–99)
Potassium: 3.8 mmol/L (ref 3.5–5.2)
Sodium: 141 mmol/L (ref 134–144)
Total Protein: 6.8 g/dL (ref 6.0–8.5)
eGFR: 88 mL/min/{1.73_m2} (ref >59)

## 2023-08-13 LAB — CBC WITH DIFFERENTIAL/PLATELET
Basophils Absolute: 0 10*3/uL (ref 0.0–0.2)
Basos: 1 %
EOS (ABSOLUTE): 0.4 10*3/uL (ref 0.0–0.4)
Eos: 6 %
Hematocrit: 40.8 % (ref 34.0–46.6)
Hemoglobin: 13.1 g/dL (ref 11.1–15.9)
Immature Grans (Abs): 0 10*3/uL (ref 0.0–0.1)
Immature Granulocytes: 0 %
Lymphocytes Absolute: 1.6 10*3/uL (ref 0.7–3.1)
Lymphs: 25 %
MCH: 28.2 pg (ref 26.6–33.0)
MCHC: 32.1 g/dL (ref 31.5–35.7)
MCV: 88 fL (ref 79–97)
Monocytes Absolute: 0.6 10*3/uL (ref 0.1–0.9)
Monocytes: 9 %
Neutrophils Absolute: 3.8 10*3/uL (ref 1.4–7.0)
Neutrophils: 59 %
Platelets: 292 10*3/uL (ref 150–450)
RBC: 4.65 x10E6/uL (ref 3.77–5.28)
RDW: 13.6 % (ref 11.7–15.4)
WBC: 6.4 10*3/uL (ref 3.4–10.8)

## 2023-08-13 LAB — THYROID PANEL WITH TSH
Free Thyroxine Index: 1.4 (ref 1.2–4.9)
T3 Uptake Ratio: 24 % (ref 24–39)
T4, Total: 5.9 ug/dL (ref 4.5–12.0)
TSH: 1.53 u[IU]/mL (ref 0.450–4.500)

## 2023-08-13 LAB — AMYLASE: Amylase: 58 U/L (ref 31–110)

## 2023-08-13 LAB — LIPASE: Lipase: 27 U/L (ref 14–72)

## 2023-08-13 LAB — LIPID PANEL
Chol/HDL Ratio: 3.8 {ratio} (ref 0.0–4.4)
Cholesterol, Total: 192 mg/dL (ref 100–199)
HDL: 50 mg/dL (ref >39)
LDL Chol Calc (NIH): 112 mg/dL — ABNORMAL HIGH (ref 0–99)
Triglycerides: 174 mg/dL — ABNORMAL HIGH (ref 0–149)
VLDL Cholesterol Cal: 30 mg/dL (ref 5–40)

## 2023-08-15 ENCOUNTER — Other Ambulatory Visit: Payer: Self-pay | Admitting: Family Medicine

## 2023-08-15 DIAGNOSIS — I10 Essential (primary) hypertension: Secondary | ICD-10-CM

## 2023-08-16 ENCOUNTER — Other Ambulatory Visit: Payer: Self-pay | Admitting: *Deleted

## 2023-08-16 DIAGNOSIS — I10 Essential (primary) hypertension: Secondary | ICD-10-CM

## 2023-08-16 MED ORDER — LISINOPRIL 20 MG PO TABS: 20.0000 mg | ORAL_TABLET | Freq: Every day | ORAL | 0 refills | Status: DC

## 2023-09-02 ENCOUNTER — Ambulatory Visit: Payer: Medicare HMO | Admitting: Family Medicine

## 2023-09-07 ENCOUNTER — Ambulatory Visit: Payer: Medicare HMO | Admitting: Family Medicine

## 2023-09-08 ENCOUNTER — Ambulatory Visit: Payer: Medicare HMO | Admitting: Family Medicine

## 2023-09-08 VITALS — BP 120/67 | HR 58 | Temp 97.6°F | Ht 66.0 in | Wt 185.6 lb

## 2023-09-08 DIAGNOSIS — I10 Essential (primary) hypertension: Secondary | ICD-10-CM

## 2023-09-08 DIAGNOSIS — E782 Mixed hyperlipidemia: Secondary | ICD-10-CM

## 2023-09-08 DIAGNOSIS — R1013 Epigastric pain: Secondary | ICD-10-CM | POA: Diagnosis not present

## 2023-09-08 NOTE — Progress Notes (Signed)
Subjective:  Patient ID: Krista Gonzales, female    DOB: 10-Mar-1951, 72 y.o.   MRN: [id number]  Patient Care Team: Sonny Masters, FNP as PCP - General (Family Medicine)   Chief Complaint:  Abdominal Pain (Epigastric pain - 3 week follow up - patient states she has no more abd pain. )   HPI: Krista Gonzales is a 72 y.o. female presenting on 09/08/2023 for Abdominal Pain (Epigastric pain - 3 week follow up - patient states she has no more abd pain. )   Discussed the use of AI scribe software for clinical note transcription with the patient, who gave verbal consent to proceed.  History of Present Illness   The patient, with a history of hypertension and gastroesophageal reflux disease (GERD), reports significant improvement in her condition since last visit. She has been adhering to a twice-daily regimen of Pepcid, which has effectively managed her symptoms, with no recurrence of abdominal or back pain. She denies any changes in bowel or bladder habits and reports no reflux symptoms.  The patient's cholesterol levels were slightly elevated, specifically the triglycerides and LDL. However, she has been advised to manage this through dietary changes.  Regarding her hypertension, the patient reports not taking her Lisinopril unless her blood pressure is elevated. She continues to take Lasix daily and reports no issues with increased urination.          Relevant past medical, surgical, family, and social history reviewed and updated as indicated.  Allergies and medications reviewed and updated. Data reviewed: Chart in Epic.   Past Medical History:  Diagnosis Date   Hypertension     Past Surgical History:  Procedure Laterality Date   ABDOMINAL HYSTERECTOMY     BREAST EXCISIONAL BIOPSY Right    GALLBLADDER SURGERY     HERNIA REPAIR     TUMOR REMOVAL      Social History   Socioeconomic History   Marital status: Married    Spouse name: Dwight   Number of children:  2   Years of education: 12   Highest education level: GED or equivalent  Occupational History   Occupation: Retired  Tobacco Use   Smoking status: Never   Smokeless tobacco: Never  Substance and Sexual Activity   Alcohol use: No   Drug use: No   Sexual activity: Not Currently  Other Topics Concern   Not on file  Social History Narrative   Lives in one level home with her husband.   Family/ children live nearby   Social Determinants of Health   Financial Resource Strain: Low Risk  (09/08/2023)   Overall Financial Resource Strain (CARDIA)    Difficulty of Paying Living Expenses: Not very hard  Recent Concern: Financial Resource Strain - Medium Risk (08/11/2023)   Overall Financial Resource Strain (CARDIA)    Difficulty of Paying Living Expenses: Somewhat hard  Food Insecurity: No Food Insecurity (09/08/2023)   Hunger Vital Sign    Worried About Running Out of Food in the Last Year: Never true    Ran Out of Food in the Last Year: Never true  Transportation Needs: No Transportation Needs (09/08/2023)   PRAPARE - Administrator, Civil Service (Medical): No    Lack of Transportation (Non-Medical): No  Physical Activity: Sufficiently Active (09/08/2023)   Exercise Vital Sign    Days of Exercise per Week: 5 days    Minutes of Exercise per Session: 30 min  Recent Concern: Physical Activity - Insufficiently  Active (08/11/2023)   Exercise Vital Sign    Days of Exercise per Week: 2 days    Minutes of Exercise per Session: 20 min  Stress: No Stress Concern Present (09/08/2023)   [employer] of Occupational Health - Occupational Stress Questionnaire    Feeling of Stress : Not at all  Social Connections: Moderately Integrated (09/08/2023)   Social Connection and Isolation Panel [NHANES]    Frequency of Communication with Friends and Family: More than three times a week    Frequency of Social Gatherings with Friends and Family: Twice a week    Attends Religious  Services: 1 to 4 times per year    Active Member of [employer] or Organizations: No    Attends Banker Meetings: Never    Marital Status: Married  Catering manager Violence: Not At Risk (02/10/2023)   Humiliation, Afraid, Rape, and Kick questionnaire    Fear of Current or Ex-Partner: No    Emotionally Abused: No    Physically Abused: No    Sexually Abused: No    Outpatient Encounter Medications as of 09/08/2023  Medication Sig   famotidine (PEPCID) 20 MG tablet Take 1 tablet (20 mg total) by mouth 2 (two) times daily.   furosemide (LASIX) 20 MG tablet TAKE 1 TABLET EVERY DAY   lisinopril (ZESTRIL) 20 MG tablet Take 1 tablet (20 mg total) by mouth daily.   Multiple Vitamins-Minerals (MULTIVITAMIN WITH MINERALS) tablet Take 1 tablet by mouth daily.    polyethylene glycol (MIRALAX / GLYCOLAX) 17 g packet Take 17 g by mouth daily.   No facility-administered encounter medications on file as of 09/08/2023.    No Known Allergies  Pertinent ROS per HPI, otherwise unremarkable      Objective:  BP 120/67   Pulse (!) 58   Temp 97.6 F (36.4 C) (Temporal)   Ht 5\' 6"  (1.676 m)   Wt 185 lb 9.6 oz (84.2 kg)   SpO2 94%   BMI 29.96 kg/m    Wt Readings from Last 3 Encounters:  09/08/23 185 lb 9.6 oz (84.2 kg)  08/12/23 186 lb 9.6 oz (84.6 kg)  02/10/23 179 lb (81.2 kg)    Physical Exam Vitals and nursing note reviewed.  Constitutional:      General: She is not in acute distress.    Appearance: She is well-developed. She is not ill-appearing, toxic-appearing or diaphoretic.  HENT:     Head: Normocephalic and atraumatic.  Eyes:     Conjunctiva/sclera: Conjunctivae normal.     Pupils: Pupils are equal, round, and reactive to light.  Cardiovascular:     Rate and Rhythm: Normal rate.  Pulmonary:     Effort: Pulmonary effort is normal.  Skin:    General: Skin is warm and dry.     Capillary Refill: Capillary refill takes less than 2 seconds.  Neurological:     General: No  focal deficit present.     Mental Status: She is alert and oriented to person, place, and time.  Psychiatric:        Mood and Affect: Mood normal.        Behavior: Behavior normal.        Thought Content: Thought content normal.        Judgment: Judgment normal.    Physical Exam   VITALS: BP- 120/67        Results for orders placed or performed in visit on 08/12/23  CBC with Differential/Platelet  Result Value Ref Range  WBC 6.4 3.4 - 10.8 x10E3/uL   RBC 4.65 3.77 - 5.28 x10E6/uL   Hemoglobin 13.1 11.1 - 15.9 g/dL   Hematocrit 06.3 01.6 - 46.6 %   MCV 88 79 - 97 fL   MCH 28.2 26.6 - 33.0 pg   MCHC 32.1 31.5 - 35.7 g/dL   RDW 01.0 93.2 - 35.5 %   Platelets 292 150 - 450 x10E3/uL   Neutrophils 59 Not Estab. %   Lymphs 25 Not Estab. %   Monocytes 9 Not Estab. %   Eos 6 Not Estab. %   Basos 1 Not Estab. %   Neutrophils Absolute 3.8 1.4 - 7.0 x10E3/uL   Lymphocytes Absolute 1.6 0.7 - 3.1 x10E3/uL   Monocytes Absolute 0.6 0.1 - 0.9 x10E3/uL   EOS (ABSOLUTE) 0.4 0.0 - 0.4 x10E3/uL   Basophils Absolute 0.0 0.0 - 0.2 x10E3/uL   Immature Granulocytes 0 Not Estab. %   Immature Grans (Abs) 0.0 0.0 - 0.1 x10E3/uL  CMP14+EGFR  Result Value Ref Range   Glucose 89 70 - 99 mg/dL   BUN 18 8 - 27 mg/dL   Creatinine, Ser 7.32 0.57 - 1.00 mg/dL   eGFR 88 >20 UR/KYH/0.62   BUN/Creatinine Ratio 24 12 - 28   Sodium 141 134 - 144 mmol/L   Potassium 3.8 3.5 - 5.2 mmol/L   Chloride 100 96 - 106 mmol/L   CO2 26 20 - 29 mmol/L   Calcium 9.5 8.7 - 10.3 mg/dL   Total Protein 6.8 6.0 - 8.5 g/dL   Albumin 4.5 3.9 - 4.9 g/dL   Globulin, Total 2.3 1.5 - 4.5 g/dL   Bilirubin Total 0.3 0.0 - 1.2 mg/dL   Alkaline Phosphatase 92 44 - 121 IU/L   AST 19 0 - 40 IU/L   ALT 15 0 - 32 IU/L  Amylase  Result Value Ref Range   Amylase 58 31 - 110 U/L  Lipid panel  Result Value Ref Range   Cholesterol, Total 192 100 - 199 mg/dL   Triglycerides 376 (H) 0 - 149 mg/dL   HDL 50 >28 mg/dL   VLDL  Cholesterol Cal 30 5 - 40 mg/dL   LDL Chol Calc (NIH) 315 (H) 0 - 99 mg/dL   Chol/HDL Ratio 3.8 0.0 - 4.4 ratio  Thyroid Panel With TSH  Result Value Ref Range   TSH 1.530 0.450 - 4.500 uIU/mL   T4, Total 5.9 4.5 - 12.0 ug/dL   T3 Uptake Ratio 24 24 - 39 %   Free Thyroxine Index 1.4 1.2 - 4.9  Lipase  Result Value Ref Range   Lipase 27 14 - 72 U/L       Pertinent labs & imaging results that were available during my care of the patient were reviewed by me and considered in my medical decision making.  Assessment & Plan:  Kaydyn was seen today for abdominal pain.  Diagnoses and all orders for this visit:  Epigastric pain  Essential hypertension  Mixed hyperlipidemia     Assessment and Plan    Gastroesophageal Reflux Disease (GERD) No current symptoms. On Pepcid twice daily for 3 weeks with good response. -Continue Pepcid twice daily for 4 more weeks. -Taper to once daily for 1 week, then every other day for 1 week, then three times a week, then stop. -If symptoms return, restart Pepcid and notify the office.  Hyperlipidemia Elevated LDL and triglycerides on recent labs. -Dietary modifications recommended.  Hypertension Blood pressure well controlled without regular use  of Lisinopril. -Continue current management with Lisinopril as needed. -Continue daily Lasix.          Continue all other maintenance medications.  Follow up plan: Return if symptoms worsen or fail to improve.   Continue healthy lifestyle choices, including diet (rich in fruits, vegetables, and lean proteins, and low in salt and simple carbohydrates) and exercise (at least 30 minutes of moderate physical activity daily).    The above assessment and management plan was discussed with the patient. The patient verbalized understanding of and has agreed to the management plan. Patient is aware to call the clinic if they develop any new symptoms or if symptoms persist or worsen. Patient is aware when  to return to the clinic for a follow-up visit. Patient educated on when it is appropriate to go to the emergency department.   Kari Baars, FNP-C [facility] [phone]

## 2023-11-06 DIAGNOSIS — K429 Umbilical hernia without obstruction or gangrene: Secondary | ICD-10-CM | POA: Diagnosis not present

## 2023-11-06 DIAGNOSIS — I509 Heart failure, unspecified: Secondary | ICD-10-CM | POA: Diagnosis not present

## 2023-11-06 DIAGNOSIS — I110 Hypertensive heart disease with heart failure: Secondary | ICD-10-CM | POA: Diagnosis not present

## 2023-11-06 DIAGNOSIS — Z90710 Acquired absence of both cervix and uterus: Secondary | ICD-10-CM | POA: Diagnosis not present

## 2023-11-06 DIAGNOSIS — K5900 Constipation, unspecified: Secondary | ICD-10-CM | POA: Diagnosis not present

## 2023-11-06 DIAGNOSIS — R110 Nausea: Secondary | ICD-10-CM | POA: Diagnosis not present

## 2023-11-06 DIAGNOSIS — R1032 Left lower quadrant pain: Secondary | ICD-10-CM | POA: Diagnosis not present

## 2023-11-08 ENCOUNTER — Ambulatory Visit: Payer: Medicare HMO

## 2023-11-20 ENCOUNTER — Other Ambulatory Visit: Payer: Self-pay | Admitting: Family Medicine

## 2023-11-20 DIAGNOSIS — I10 Essential (primary) hypertension: Secondary | ICD-10-CM

## 2023-11-30 ENCOUNTER — Encounter: Payer: Medicare HMO | Admitting: Family Medicine

## 2024-02-02 ENCOUNTER — Ambulatory Visit (INDEPENDENT_AMBULATORY_CARE_PROVIDER_SITE_OTHER): Admitting: Family Medicine

## 2024-02-02 VITALS — BP 126/79 | HR 60 | Temp 97.7°F | Ht 66.0 in | Wt 186.2 lb

## 2024-02-02 DIAGNOSIS — M810 Age-related osteoporosis without current pathological fracture: Secondary | ICD-10-CM | POA: Diagnosis not present

## 2024-02-02 DIAGNOSIS — E538 Deficiency of other specified B group vitamins: Secondary | ICD-10-CM

## 2024-02-02 DIAGNOSIS — E782 Mixed hyperlipidemia: Secondary | ICD-10-CM

## 2024-02-02 DIAGNOSIS — Z6830 Body mass index (BMI) 30.0-30.9, adult: Secondary | ICD-10-CM

## 2024-02-02 DIAGNOSIS — I10 Essential (primary) hypertension: Secondary | ICD-10-CM | POA: Diagnosis not present

## 2024-02-02 DIAGNOSIS — E8881 Metabolic syndrome: Secondary | ICD-10-CM | POA: Diagnosis not present

## 2024-02-02 DIAGNOSIS — E66811 Obesity, class 1: Secondary | ICD-10-CM | POA: Diagnosis not present

## 2024-02-02 DIAGNOSIS — E559 Vitamin D deficiency, unspecified: Secondary | ICD-10-CM

## 2024-02-02 LAB — LIPID PANEL

## 2024-02-02 MED ORDER — FUROSEMIDE 20 MG PO TABS
20.0000 mg | ORAL_TABLET | Freq: Every day | ORAL | 1 refills | Status: DC
Start: 2024-02-02 — End: 2024-08-21

## 2024-02-02 MED ORDER — SEMAGLUTIDE(0.25 OR 0.5MG/DOS) 2 MG/3ML ~~LOC~~ SOPN: 0.2500 mg | PEN_INJECTOR | SUBCUTANEOUS | 3 refills | Status: DC

## 2024-02-02 MED ORDER — LISINOPRIL 20 MG PO TABS: 20.0000 mg | ORAL_TABLET | Freq: Every day | ORAL | 1 refills | Status: DC

## 2024-02-02 NOTE — Progress Notes (Signed)
 Subjective:  Patient ID: Krista Gonzales, female    DOB: 1951/05/26, 73 y.o.   MRN: [id number]  Patient Care Team: Sonny Masters, FNP as PCP - General (Family Medicine)   Chief Complaint:  Medical Management of Chronic Issues   HPI: Krista Gonzales is a 73 y.o. female presenting on 02/02/2024 for Medical Management of Chronic Issues   Discussed the use of AI scribe software for clinical note transcription with the patient, who gave verbal consent to proceed.  History of Present Illness   Krista Gonzales is a 73 year old female who presents for medication refills and assistance with Ozempic.  She is seeking assistance with obtaining Ozempic through a patient assistance program, as she previously used it but is currently not on it due to lack of insurance coverage. She is currently taking lisinopril and furosemide for hypertension without any issues such as cough, chest pain, leg swelling, shortness of breath, headaches, or confusion.  She has a history of osteoporosis, managed with calcium and vitamin D supplements. She has not used bisphosphonates like Fosamax and prefers not to. There have been no significant changes in vision, although she required stronger glasses last year.  She takes over-the-counter vitamin D, likely 2000 IU daily, for osteoporosis, and B12 supplements for a deficiency. No neuropathy, numbness, tingling, paresthesias, bone aches, or trouble walking are reported.  In terms of diet, she consumes little meat, enjoys vegetables, and occasionally eats hamburgers. She does not take any over-the-counter supplements for cholesterol management, such as fish oil or red yeast rice.          Relevant past medical, surgical, family, and social history reviewed and updated as indicated.  Allergies and medications reviewed and updated. Data reviewed: Chart in Epic.   Past Medical History:  Diagnosis Date   Hypertension     Past Surgical History:   Procedure Laterality Date   ABDOMINAL HYSTERECTOMY     BREAST EXCISIONAL BIOPSY Right    GALLBLADDER SURGERY     HERNIA REPAIR     TUMOR REMOVAL      Social History   Socioeconomic History   Marital status: Married    Spouse name: Dwight   Number of children: 2   Years of education: 12   Highest education level: GED or equivalent  Occupational History   Occupation: Retired  Tobacco Use   Smoking status: Never   Smokeless tobacco: Never  Substance and Sexual Activity   Alcohol use: No   Drug use: No   Sexual activity: Not Currently  Other Topics Concern   Not on file  Social History Narrative   Lives in one level home with her husband.   Family/ children live nearby   Social Drivers of Health   Financial Resource Strain: Low Risk  (02/02/2024)   Overall Financial Resource Strain (CARDIA)    Difficulty of Paying Living Expenses: Not very hard  Food Insecurity: No Food Insecurity (02/02/2024)   Hunger Vital Sign    Worried About Running Out of Food in the Last Year: Never true    Ran Out of Food in the Last Year: Never true  Transportation Needs: No Transportation Needs (02/02/2024)   PRAPARE - Administrator, Civil Service (Medical): No    Lack of Transportation (Non-Medical): No  Physical Activity: Insufficiently Active (02/02/2024)   Exercise Vital Sign    Days of Exercise per Week: 3 days    Minutes of Exercise per Session: 20  min  Stress: No Stress Concern Present (02/02/2024)   [employer] of Occupational Health - Occupational Stress Questionnaire    Feeling of Stress : Not at all  Social Connections: Unknown (02/02/2024)   Social Connection and Isolation Panel [NHANES]    Frequency of Communication with Friends and Family: More than three times a week    Frequency of Social Gatherings with Friends and Family: Twice a week    Attends Religious Services: Patient declined    Database administrator or Organizations: No    Attends Tax inspector Meetings: Never    Marital Status: Married  Catering manager Violence: Not At Risk (02/10/2023)   Humiliation, Afraid, Rape, and Kick questionnaire    Fear of Current or Ex-Partner: No    Emotionally Abused: No    Physically Abused: No    Sexually Abused: No    Outpatient Encounter Medications as of 02/02/2024  Medication Sig   Multiple Vitamins-Minerals (MULTIVITAMIN WITH MINERALS) tablet Take 1 tablet by mouth daily.    polyethylene glycol (MIRALAX / GLYCOLAX) 17 g packet Take 17 g by mouth daily.   Semaglutide,0.25 or 0.5MG /DOS, 2 MG/3ML SOPN Inject 0.25 mg into the skin once a week.   [DISCONTINUED] furosemide (LASIX) 20 MG tablet TAKE 1 TABLET EVERY DAY   [DISCONTINUED] lisinopril (ZESTRIL) 20 MG tablet Take 1 tablet (20 mg total) by mouth daily.   famotidine (PEPCID) 20 MG tablet Take 1 tablet (20 mg total) by mouth 2 (two) times daily.   furosemide (LASIX) 20 MG tablet Take 1 tablet (20 mg total) by mouth daily.   lisinopril (ZESTRIL) 20 MG tablet Take 1 tablet (20 mg total) by mouth daily.   No facility-administered encounter medications on file as of 02/02/2024.    No Known Allergies  Pertinent ROS per HPI, otherwise unremarkable      Objective:  BP 126/79   Pulse 60   Temp 97.7 F (36.5 C)   Ht 5\' 6"  (1.676 m)   Wt 186 lb 3.2 oz (84.5 kg)   SpO2 98%   BMI 30.05 kg/m    Wt Readings from Last 3 Encounters:  02/02/24 186 lb 3.2 oz (84.5 kg)  09/08/23 185 lb 9.6 oz (84.2 kg)  08/12/23 186 lb 9.6 oz (84.6 kg)    Physical Exam Vitals and nursing note reviewed.  Constitutional:      General: She is not in acute distress.    Appearance: Normal appearance. She is obese. She is not ill-appearing, toxic-appearing or diaphoretic.  HENT:     Head: Normocephalic and atraumatic.     Nose: Nose normal.     Mouth/Throat:     Mouth: Mucous membranes are moist.  Eyes:     Conjunctiva/sclera: Conjunctivae normal.     Pupils: Pupils are equal, round, and  reactive to light.  Cardiovascular:     Rate and Rhythm: Normal rate and regular rhythm.     Heart sounds: Normal heart sounds.  Pulmonary:     Effort: Pulmonary effort is normal.     Breath sounds: Normal breath sounds.  Musculoskeletal:     Cervical back: Neck supple.     Right lower leg: No edema.     Left lower leg: No edema.  Skin:    General: Skin is warm and dry.     Capillary Refill: Capillary refill takes less than 2 seconds.  Neurological:     General: No focal deficit present.     Mental Status: She is  alert and oriented to person, place, and time.  Psychiatric:        Mood and Affect: Mood normal.        Behavior: Behavior normal.        Thought Content: Thought content normal.        Judgment: Judgment normal.      Results for orders placed or performed in visit on 08/12/23  CBC with Differential/Platelet   Collection Time: 08/12/23  3:37 PM  Result Value Ref Range   WBC 6.4 3.4 - 10.8 x10E3/uL   RBC 4.65 3.77 - 5.28 x10E6/uL   Hemoglobin 13.1 11.1 - 15.9 g/dL   Hematocrit 21.3 08.6 - 46.6 %   MCV 88 79 - 97 fL   MCH 28.2 26.6 - 33.0 pg   MCHC 32.1 31.5 - 35.7 g/dL   RDW 57.8 46.9 - 62.9 %   Platelets 292 150 - 450 x10E3/uL   Neutrophils 59 Not Estab. %   Lymphs 25 Not Estab. %   Monocytes 9 Not Estab. %   Eos 6 Not Estab. %   Basos 1 Not Estab. %   Neutrophils Absolute 3.8 1.4 - 7.0 x10E3/uL   Lymphocytes Absolute 1.6 0.7 - 3.1 x10E3/uL   Monocytes Absolute 0.6 0.1 - 0.9 x10E3/uL   EOS (ABSOLUTE) 0.4 0.0 - 0.4 x10E3/uL   Basophils Absolute 0.0 0.0 - 0.2 x10E3/uL   Immature Granulocytes 0 Not Estab. %   Immature Grans (Abs) 0.0 0.0 - 0.1 x10E3/uL  CMP14+EGFR   Collection Time: 08/12/23  3:37 PM  Result Value Ref Range   Glucose 89 70 - 99 mg/dL   BUN 18 8 - 27 mg/dL   Creatinine, Ser 5.28 0.57 - 1.00 mg/dL   eGFR 88 >41 LK/GMW/1.02   BUN/Creatinine Ratio 24 12 - 28   Sodium 141 134 - 144 mmol/L   Potassium 3.8 3.5 - 5.2 mmol/L   Chloride 100  96 - 106 mmol/L   CO2 26 20 - 29 mmol/L   Calcium 9.5 8.7 - 10.3 mg/dL   Total Protein 6.8 6.0 - 8.5 g/dL   Albumin 4.5 3.9 - 4.9 g/dL   Globulin, Total 2.3 1.5 - 4.5 g/dL   Bilirubin Total 0.3 0.0 - 1.2 mg/dL   Alkaline Phosphatase 92 44 - 121 IU/L   AST 19 0 - 40 IU/L   ALT 15 0 - 32 IU/L  Amylase   Collection Time: 08/12/23  3:37 PM  Result Value Ref Range   Amylase 58 31 - 110 U/L  Lipid panel   Collection Time: 08/12/23  3:37 PM  Result Value Ref Range   Cholesterol, Total 192 100 - 199 mg/dL   Triglycerides 725 (H) 0 - 149 mg/dL   HDL 50 >36 mg/dL   VLDL Cholesterol Cal 30 5 - 40 mg/dL   LDL Chol Calc (NIH) 644 (H) 0 - 99 mg/dL   Chol/HDL Ratio 3.8 0.0 - 4.4 ratio  Thyroid Panel With TSH   Collection Time: 08/12/23  3:37 PM  Result Value Ref Range   TSH 1.530 0.450 - 4.500 uIU/mL   T4, Total 5.9 4.5 - 12.0 ug/dL   T3 Uptake Ratio 24 24 - 39 %   Free Thyroxine Index 1.4 1.2 - 4.9  Lipase   Collection Time: 08/12/23  3:37 PM  Result Value Ref Range   Lipase 27 14 - 72 U/L       Pertinent labs & imaging results that were available during my care of  the patient were reviewed by me and considered in my medical decision making.  Assessment & Plan:  Zarahi was seen today for medical management of chronic issues.  Diagnoses and all orders for this visit:  Essential hypertension -     furosemide (LASIX) 20 MG tablet; Take 1 tablet (20 mg total) by mouth daily. -     lisinopril (ZESTRIL) 20 MG tablet; Take 1 tablet (20 mg total) by mouth daily. -     Semaglutide,0.25 or 0.5MG /DOS, 2 MG/3ML SOPN; Inject 0.25 mg into the skin once a week.  Mixed hyperlipidemia -     Semaglutide,0.25 or 0.5MG /DOS, 2 MG/3ML SOPN; Inject 0.25 mg into the skin once a week.  Metabolic syndrome -     CMP14+EGFR -     Anemia Profile B -     Lipid panel -     Thyroid Panel With TSH -     VITAMIN D 25 Hydroxy (Vit-D Deficiency, Fractures) -     Semaglutide,0.25 or 0.5MG /DOS, 2 MG/3ML SOPN;  Inject 0.25 mg into the skin once a week.  Obesity (BMI 30.0-34.9) -     CMP14+EGFR -     Anemia Profile B -     Lipid panel -     Thyroid Panel With TSH -     VITAMIN D 25 Hydroxy (Vit-D Deficiency, Fractures) -     Semaglutide,0.25 or 0.5MG /DOS, 2 MG/3ML SOPN; Inject 0.25 mg into the skin once a week.  Age-related osteoporosis without current pathological fracture -     CMP14+EGFR -     VITAMIN D 25 Hydroxy (Vit-D Deficiency, Fractures)  Vitamin D deficiency -     CMP14+EGFR -     VITAMIN D 25 Hydroxy (Vit-D Deficiency, Fractures)  B12 deficiency -     Anemia Profile B        Metabolic Syndrome Previously on Ozempic through a patient assistance program. Insurance does not cover Ozempic, and patient assistance programs have limited coverage. She has metabolic syndrome, hypertension, and hyperlipidemia, which may qualify her for assistance. Discussed challenges with obtaining Ozempic due to high demand and limited coverage by assistance programs. If approved, starting dose will be ordered. Advised on managing potential side effects with hydration, small frequent meals, and increased fiber intake. - Submit request for Ozempic through patient assistance program - Order starting dose of Ozempic if approved - Schedule follow-up 8 weeks after starting Ozempic to check thyroid and renal function - Advise on hydration, small frequent meals, and increased fiber intake to manage potential side effects of Ozempic  Hypertension and Hyperlipidemia Requires refills for lisinopril and furosemide. No issues reported with current medications. No symptoms of hypertension complications such as chest pain, leg swelling, or shortness of breath. - Refill lisinopril and furosemide prescriptions - Send medications via mail order to [facility]  Osteoporosis Taking calcium and vitamin D for osteoporosis. She prefers not to take additional medications like bisphosphonates. - Continue calcium and vitamin  D supplementation  Vitamin D Deficiency Taking over-the-counter vitamin D, likely 2000 IU daily. No symptoms of deficiency such as bone aches or trouble walking reported. - Order lab work to check vitamin D levels  B12 Deficiency Taking B12 supplements. No symptoms of deficiency such as neuropathy, numbness, or tingling reported. Diet is low in meat, which may affect B12 levels. - Order lab work to check B12 levels  General Health Maintenance Regularly visits the eye doctor. No significant changes in vision reported. Diet is low in meat but includes vegetables. -  Encourage regular eye exams - Encourage balanced diet with adequate protein intake          Continue all other maintenance medications.  Follow up plan: Return in about 6 months (around 08/04/2024), or if symptoms worsen or fail to improve, for Annual Physical.   Continue healthy lifestyle choices, including diet (rich in fruits, vegetables, and lean proteins, and low in salt and simple carbohydrates) and exercise (at least 30 minutes of moderate physical activity daily).  Educational handout given for DASH diet  The above assessment and management plan was discussed with the patient. The patient verbalized understanding of and has agreed to the management plan. Patient is aware to call the clinic if they develop any new symptoms or if symptoms persist or worsen. Patient is aware when to return to the clinic for a follow-up visit. Patient educated on when it is appropriate to go to the emergency department.   Kari Baars, FNP-C [facility] [phone]

## 2024-02-02 NOTE — Patient Instructions (Signed)
 Goal BP:  For patients younger than 60: Goal BP < 140/90. For patients 60 and older: Goal BP < 150/90. For patients with diabetes: Goal BP < 140/90.  Take your medications faithfully as prescribed. Maintain a healthy weight. Get at least 150 minutes of aerobic exercise per week. Minimize salt intake, less than 2000 mg per day. Minimize alcohol intake.  DASH Eating Plan DASH stands for "Dietary Approaches to Stop Hypertension." The DASH eating plan is a healthy eating plan that has been shown to reduce high blood pressure (hypertension). Additional health benefits may include reducing the risk of type 2 diabetes mellitus, heart disease, and stroke. The DASH eating plan may also help with weight loss.  WHAT DO I NEED TO KNOW ABOUT THE DASH EATING PLAN? For the DASH eating plan, you will follow these general guidelines: Choose foods with a percent daily value for sodium of less than 5% (as listed on the food label). Use salt-free seasonings or herbs instead of table salt or sea salt. Check with your health care provider or pharmacist before using salt substitutes. Eat lower-sodium products, often labeled as "lower sodium" or "no salt added." Eat fresh foods. Eat more vegetables, fruits, and low-fat dairy products. Choose whole grains. Look for the word "whole" as the first word in the ingredient list. Choose fish and skinless chicken or [place] more often than red meat. Limit fish, poultry, and meat to 6 oz (170 g) each day. Limit sweets, desserts, sugars, and sugary drinks. Choose heart-healthy fats. Limit cheese to 1 oz (28 g) per day. Eat more home-cooked food and less restaurant, buffet, and fast food. Limit fried foods. Cook foods using methods other than frying. Limit canned vegetables. If you do use them, rinse them well to decrease the sodium. When eating at a restaurant, ask that your food be prepared with less salt, or no salt if possible.  WHAT FOODS CAN I EAT? Seek help from  a dietitian for individual calorie needs.  Grains Whole grain or whole wheat bread. Brown rice. Whole grain or whole wheat pasta. Quinoa, bulgur, and whole grain cereals. Low-sodium cereals. Corn or whole wheat flour tortillas. Whole grain cornbread. Whole grain crackers. Low-sodium crackers.  Vegetables Fresh or frozen vegetables (raw, steamed, roasted, or grilled). Low-sodium or reduced-sodium tomato and vegetable juices. Low-sodium or reduced-sodium tomato sauce and paste. Low-sodium or reduced-sodium canned vegetables.   Fruits All fresh, canned (in natural juice), or frozen fruits.  Meat and Other Protein Products Ground beef (85% or leaner), grass-fed beef, or beef trimmed of fat. Skinless chicken or [place]. Ground chicken or [place]. Pork trimmed of fat. All fish and seafood. Eggs. Dried beans, peas, or lentils. Unsalted nuts and seeds. Unsalted canned beans.  Dairy Low-fat dairy products, such as skim or 1% milk, 2% or reduced-fat cheeses, low-fat ricotta or cottage cheese, or plain low-fat yogurt. Low-sodium or reduced-sodium cheeses.  Fats and Oils Tub margarines without trans fats. Light or reduced-fat mayonnaise and salad dressings (reduced sodium). Avocado. Safflower, olive, or canola oils. Natural peanut or almond butter.  Other Unsalted popcorn and pretzels. The items listed above may not be a complete list of recommended foods or beverages. Contact your dietitian for more options.  WHAT FOODS ARE NOT RECOMMENDED?  Grains White bread. White pasta. White rice. Refined cornbread. Bagels and croissants. Crackers that contain trans fat.  Vegetables Creamed or fried vegetables. Vegetables in a cheese sauce. Regular canned vegetables. Regular canned tomato sauce and paste. Regular tomato and vegetable juices.  Fruits Dried fruits. Canned fruit in light or heavy syrup. Fruit juice.  Meat and Other Protein Products Fatty cuts of meat. Ribs, chicken wings, bacon, sausage,  bologna, salami, chitterlings, fatback, hot dogs, bratwurst, and packaged luncheon meats. Salted nuts and seeds. Canned beans with salt.  Dairy Whole or 2% milk, cream, half-and-half, and cream cheese. Whole-fat or sweetened yogurt. Full-fat cheeses or blue cheese. Nondairy creamers and whipped toppings. Processed cheese, cheese spreads, or cheese curds.  Condiments Onion and garlic salt, seasoned salt, table salt, and sea salt. Canned and packaged gravies. Worcestershire sauce. Tartar sauce. Barbecue sauce. Teriyaki sauce. Soy sauce, including reduced sodium. Steak sauce. Fish sauce. Oyster sauce. Cocktail sauce. Horseradish. Ketchup and mustard. Meat flavorings and tenderizers. Bouillon cubes. Hot sauce. Tabasco sauce. Marinades. Taco seasonings. Relishes.  Fats and Oils Butter, stick margarine, lard, shortening, ghee, and bacon fat. Coconut, palm kernel, or palm oils. Regular salad dressings.  Other Pickles and olives. Salted popcorn and pretzels.  The items listed above may not be a complete list of foods and beverages to avoid. Contact your dietitian for more information.  WHERE CAN I FIND MORE INFORMATION? National Heart, Lung, and Blood Institute: [employer] Document Released: 10/15/2011 Document Revised: 03/12/2014 Document Reviewed: 08/30/2013 [facility] Patient Information 2015 [place], [place]. This information is not intended to replace advice given to you by your health care provider. Make sure you discuss any questions you have with your health care provider.   I think that you would greatly benefit from seeing a nutritionist.  If you are interested, please call Dr. Gerilyn Pilgrim at [phone] to schedule an appointment.

## 2024-02-03 ENCOUNTER — Other Ambulatory Visit: Payer: Self-pay | Admitting: *Deleted

## 2024-02-03 LAB — LIPID PANEL
Cholesterol, Total: 172 mg/dL (ref 100–199)
HDL: 49 mg/dL (ref >39)
LDL CALC COMMENT:: 3.5 ratio (ref 0.0–4.4)
LDL Chol Calc (NIH): 104 mg/dL — ABNORMAL HIGH (ref 0–99)
Triglycerides: 102 mg/dL (ref 0–149)
VLDL Cholesterol Cal: 19 mg/dL (ref 5–40)

## 2024-02-03 LAB — CMP14+EGFR
ALT: 12 IU/L (ref 0–32)
AST: 12 IU/L (ref 0–40)
Albumin: 4.2 g/dL (ref 3.9–4.9)
Alkaline Phosphatase: 94 IU/L (ref 44–121)
BUN/Creatinine Ratio: 21 (ref 12–28)
BUN: 16 mg/dL (ref 8–27)
Bilirubin Total: 0.3 mg/dL (ref 0.0–1.2)
CO2: 26 mmol/L (ref 20–29)
Calcium: 9.2 mg/dL (ref 8.7–10.3)
Chloride: 107 mmol/L — ABNORMAL HIGH (ref 96–106)
Creatinine, Ser: 0.76 mg/dL (ref 0.57–1.00)
Globulin, Total: 2.2 g/dL (ref 1.5–4.5)
Glucose: 96 mg/dL (ref 70–99)
Potassium: 4.2 mmol/L (ref 3.5–5.2)
Sodium: 144 mmol/L (ref 134–144)
Total Protein: 6.4 g/dL (ref 6.0–8.5)
eGFR: 84 mL/min/{1.73_m2} (ref >59)

## 2024-02-03 LAB — ANEMIA PROFILE B
Basophils Absolute: 0 10*3/uL (ref 0.0–0.2)
Basos: 1 %
EOS (ABSOLUTE): 0.3 10*3/uL (ref 0.0–0.4)
Eos: 6 %
Ferritin: 17 ng/mL (ref 15–150)
Folate: 16.5 ng/mL (ref >3.0)
Hematocrit: 41.7 % (ref 34.0–46.6)
Hemoglobin: 13.5 g/dL (ref 11.1–15.9)
Immature Grans (Abs): 0 10*3/uL (ref 0.0–0.1)
Immature Granulocytes: 0 %
Iron Saturation: 16 % (ref 15–55)
Iron: 55 ug/dL (ref 27–139)
Lymphocytes Absolute: 1 10*3/uL (ref 0.7–3.1)
Lymphs: 19 %
MCH: 28.5 pg (ref 26.6–33.0)
MCHC: 32.4 g/dL (ref 31.5–35.7)
MCV: 88 fL (ref 79–97)
Monocytes Absolute: 0.4 10*3/uL (ref 0.1–0.9)
Monocytes: 8 %
Neutrophils Absolute: 3.5 10*3/uL (ref 1.4–7.0)
Neutrophils: 66 %
Platelets: 244 10*3/uL (ref 150–450)
RBC: 4.73 x10E6/uL (ref 3.77–5.28)
RDW: 14.3 % (ref 11.7–15.4)
Retic Ct Pct: 1 % (ref 0.6–2.6)
Total Iron Binding Capacity: 352 ug/dL (ref 250–450)
UIBC: 297 ug/dL (ref 118–369)
Vitamin B-12: 465 pg/mL (ref 232–1245)
WBC: 5.2 10*3/uL (ref 3.4–10.8)

## 2024-02-03 LAB — THYROID PANEL WITH TSH
Free Thyroxine Index: 1.4 (ref 1.2–4.9)
T3 Uptake Ratio: 23 % — ABNORMAL LOW (ref 24–39)
T4, Total: 6.3 ug/dL (ref 4.5–12.0)
TSH: 1.21 u[IU]/mL (ref 0.450–4.500)

## 2024-02-03 LAB — VITAMIN D 25 HYDROXY (VIT D DEFICIENCY, FRACTURES): Vit D, 25-Hydroxy: 29.1 ng/mL — ABNORMAL LOW (ref 30.0–100.0)

## 2024-02-10 ENCOUNTER — Ambulatory Visit: Admitting: Family Medicine

## 2024-04-14 ENCOUNTER — Telehealth: Payer: Self-pay | Admitting: Family Medicine

## 2024-04-14 NOTE — Telephone Encounter (Signed)
 Aware paperwork ready

## 2024-05-10 ENCOUNTER — Ambulatory Visit

## 2024-05-11 ENCOUNTER — Encounter: Admitting: Family Medicine

## 2024-05-15 ENCOUNTER — Telehealth: Payer: Self-pay | Admitting: Family Medicine

## 2024-05-16 NOTE — Telephone Encounter (Signed)
 Aware paperwork faxed to [facility] and emailed to her.

## 2024-05-17 ENCOUNTER — Other Ambulatory Visit: Payer: Self-pay | Admitting: Family Medicine

## 2024-05-17 DIAGNOSIS — I10 Essential (primary) hypertension: Secondary | ICD-10-CM

## 2024-05-18 ENCOUNTER — Encounter: Payer: Self-pay | Admitting: Family Medicine

## 2024-05-18 NOTE — Telephone Encounter (Signed)
 Na letter mailed

## 2024-05-18 NOTE — Telephone Encounter (Signed)
 Krista Gonzales in Sept for 6 mos FU RF sent to pharmacy

## 2024-06-01 ENCOUNTER — Other Ambulatory Visit: Payer: Self-pay | Admitting: Family Medicine

## 2024-06-01 DIAGNOSIS — Z1231 Encounter for screening mammogram for malignant neoplasm of breast: Secondary | ICD-10-CM

## 2024-06-16 ENCOUNTER — Ambulatory Visit
Admission: RE | Admit: 2024-06-16 | Discharge: 2024-06-16 | Disposition: A | Discharge status: Home / Self Care | Source: Ambulatory Visit | Attending: Family Medicine | Admitting: Family Medicine

## 2024-06-16 DIAGNOSIS — Z1231 Encounter for screening mammogram for malignant neoplasm of breast: Secondary | ICD-10-CM | POA: Diagnosis not present

## 2024-06-21 ENCOUNTER — Ambulatory Visit: Payer: Self-pay | Admitting: Family Medicine

## 2024-07-21 ENCOUNTER — Encounter: Payer: Self-pay | Admitting: Family

## 2024-07-21 ENCOUNTER — Ambulatory Visit: Payer: Self-pay | Admitting: Family

## 2024-07-21 ENCOUNTER — Ambulatory Visit (INDEPENDENT_AMBULATORY_CARE_PROVIDER_SITE_OTHER): Admitting: Family

## 2024-07-21 VITALS — BP 130/80 | HR 58 | Temp 97.3°F | Ht 66.0 in | Wt 186.6 lb

## 2024-07-21 DIAGNOSIS — M545 Low back pain, unspecified: Secondary | ICD-10-CM | POA: Diagnosis not present

## 2024-07-21 DIAGNOSIS — R300 Dysuria: Secondary | ICD-10-CM | POA: Diagnosis not present

## 2024-07-21 LAB — URINALYSIS, COMPLETE
Bilirubin, UA: NEGATIVE
Glucose, UA: NEGATIVE
Ketones, UA: NEGATIVE
Leukocytes,UA: NEGATIVE
Nitrite, UA: NEGATIVE
Protein,UA: NEGATIVE
RBC, UA: NEGATIVE
Specific Gravity, UA: 1.01 (ref 1.005–1.030)
Urobilinogen, Ur: 0.2 mg/dL (ref 0.2–1.0)
pH, UA: 7 (ref 5.0–7.5)

## 2024-07-21 MED ORDER — DICLOFENAC SODIUM 75 MG PO TBEC: 75.0000 mg | DELAYED_RELEASE_TABLET | Freq: Two times a day (BID) | ORAL | 0 refills | Status: DC

## 2024-07-21 MED ORDER — BACLOFEN 10 MG PO TABS: 10.0000 mg | ORAL_TABLET | Freq: Three times a day (TID) | ORAL | 0 refills | Status: DC

## 2024-07-21 NOTE — Progress Notes (Signed)
 Subjective:    Patient ID: Krista Gonzales, female    DOB: 04-Apr-1951, 73 y.o.   MRN: [id number]  Chief Complaint  Patient presents with   Acute Visit   Pt presents to the office today with right lower back and lower right abdominal pain.  Reports mild urinary frequency.   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 Reports aching pain of 4 out 10.  Back Pain This is a new problem. The current episode started 1 to 4 weeks ago. The problem occurs constantly. The problem has been waxing and waning since onset. The pain is present in the lumbar spine (right lower back). The quality of the pain is described as aching. The pain is at a severity of 4/10. The pain is mild. The symptoms are aggravated by sitting. Pertinent negatives include no dysuria, fever, headaches, paresis, paresthesias, pelvic pain, perianal numbness, tingling or weakness. Risk factors include obesity. She has tried heat (muscle rub) for the symptoms. The treatment provided mild relief.      Review of Systems  Constitutional:  Negative for fever.  Genitourinary:  Negative for dysuria and pelvic pain.  Musculoskeletal:  Positive for back pain.  Neurological:  Negative for tingling, weakness, headaches and paresthesias.  All other systems reviewed and are negative.   Social History   Socioeconomic History   Marital status: Married    Spouse name: Dwight   Number of children: 2   Years of education: 12   Highest education level: GED or equivalent  Occupational History   Occupation: Retired  Tobacco Use   Smoking status: Never   Smokeless tobacco: Never  Substance and Sexual Activity   Alcohol use: No   Drug use: No   Sexual activity: Not Currently  Other Topics Concern   Not on file  Social History Narrative   Lives in one level home with her husband.   Family/ children live nearby   Social Drivers of Health   Financial Resource Strain: Patient Declined  (07/20/2024)   Overall Financial Resource Strain (CARDIA)    Difficulty of Paying Living Expenses: Patient declined  Food Insecurity: No Food Insecurity (07/20/2024)   Hunger Vital Sign    Worried About Running Out of Food in the Last Year: Never true    Ran Out of Food in the Last Year: Never true  Transportation Needs: No Transportation Needs (07/20/2024)   PRAPARE - Administrator, Civil Service (Medical): No    Lack of Transportation (Non-Medical): No  Physical Activity: Insufficiently Active (07/20/2024)   Exercise Vital Sign    Days of Exercise per Week: 4 days    Minutes of Exercise per Session: 30 min  Stress: Patient Declined (07/20/2024)   [employer] of Occupational Health - Occupational Stress Questionnaire    Feeling of Stress: Patient declined  Social Connections: Moderately Isolated (07/20/2024)   Social Connection and Isolation Panel    Frequency of Communication with Friends and Family: More than three times a week    Frequency of Social Gatherings with Friends and Family: Twice a week    Attends Religious Services: Patient declined    Database administrator or Organizations: No    Attends Engineer, structural: Not on file    Marital Status: Married   Family History  Problem Relation Age of Onset   Breast cancer Neg Hx         Objective:   Physical Exam  Vitals reviewed.  Constitutional:      General: She is not in acute distress.    Appearance: She is well-developed.  HENT:     Head: Normocephalic and atraumatic.  Eyes:     Pupils: Pupils are equal, round, and reactive to light.  Neck:     Thyroid : No thyromegaly.  Cardiovascular:     Rate and Rhythm: Normal rate and regular rhythm.     Heart sounds: Normal heart sounds. No murmur heard. Pulmonary:     Effort: Pulmonary effort is normal. No respiratory distress.     Breath sounds: Normal breath sounds. No wheezing.  Abdominal:     General: Bowel sounds are normal. There is no  distension.     Palpations: Abdomen is soft.     Tenderness: There is no abdominal tenderness. There is no right CVA tenderness or left CVA tenderness.  Musculoskeletal:        General: No tenderness. Normal range of motion.     Cervical back: Normal range of motion and neck supple.     Comments: Full ROM of lumbar  Skin:    General: Skin is warm and dry.  Neurological:     Mental Status: She is alert and oriented to person, place, and time.     Cranial Nerves: No cranial nerve deficit.     Deep Tendon Reflexes: Reflexes are normal and symmetric.  Psychiatric:        Behavior: Behavior normal.        Thought Content: Thought content normal.        Judgment: Judgment normal.       BP 130/80   Pulse (!) 58   Temp (!) 97.3 F (36.3 C)   Ht 5' 6 (1.676 m)   Wt 186 lb 9.6 oz (84.6 kg)   SpO2 99%   BMI 30.12 kg/m      Assessment & Plan:  [facility] Hensley Aziz comes in today with chief complaint of Acute Visit   Diagnosis and orders addressed:  1. Acute right-sided low back pain without sciatica (Primary) Rest Ice  ROM exercises  Start diclofenac  BID with food, no other NSAID's  Sedation precautions discussed with baclofen   Follow up if symptoms worsen or do not improve  - Urinalysis, Complete - diclofenac  (VOLTAREN ) 75 MG EC tablet; Take 1 tablet (75 mg total) by mouth 2 (two) times daily.  Dispense: 60 tablet; Refill: 0 - baclofen  (LIORESAL ) 10 MG tablet; Take 1 tablet (10 mg total) by mouth 3 (three) times daily.  Dispense: 45 each; Refill: 0     Bari Learn, FNP

## 2024-07-21 NOTE — Patient Instructions (Signed)
 Acute Back Pain, Adult Acute back pain is sudden and usually short-lived. It is often caused by an injury to the muscles and tissues in the back. The injury may result from: A muscle, tendon, or ligament getting overstretched or torn. Ligaments are tissues that connect bones to each other. Lifting something improperly can cause a back strain. Wear and tear (degeneration) of the spinal disks. Spinal disks are circular tissue that provide cushioning between the bones of the spine (vertebrae). Twisting motions, such as while playing sports or doing yard work. A hit to the back. Arthritis. You may have a physical exam, lab tests, and imaging tests to find the cause of your pain. Acute back pain usually goes away with rest and home care. Follow these instructions at home: Managing pain, stiffness, and swelling Take over-the-counter and prescription medicines only as told by your health care provider. Treatment may include medicines for pain and inflammation that are taken by mouth or applied to the skin, or muscle relaxants. Your health care provider may recommend applying ice during the first 24-48 hours after your pain starts. To do this: Put ice in a plastic bag. Place a towel between your skin and the bag. Leave the ice on for 20 minutes, 2-3 times a day. Remove the ice if your skin turns bright red. This is very important. If you cannot feel pain, heat, or cold, you have a greater risk of damage to the area. If directed, apply heat to the affected area as often as told by your health care provider. Use the heat source that your health care provider recommends, such as a moist heat pack or a heating pad. Place a towel between your skin and the heat source. Leave the heat on for 20-30 minutes. Remove the heat if your skin turns bright red. This is especially important if you are unable to feel pain, heat, or cold. You have a greater risk of getting burned. Activity  Do not stay in bed. Staying in  bed for more than 1-2 days can delay your recovery. Sit up and stand up straight. Avoid leaning forward when you sit or hunching over when you stand. If you work at a desk, sit close to it so you do not need to lean over. Keep your chin tucked in. Keep your neck drawn back, and keep your elbows bent at a 90-degree angle (right angle). Sit high and close to the steering wheel when you drive. Add lower back (lumbar) support to your car seat, if needed. Take short walks on even surfaces as soon as you are able. Try to increase the length of time you walk each day. Do not sit, drive, or stand in one place for more than 30 minutes at a time. Sitting or standing for long periods of time can put stress on your back. Do not drive or use heavy machinery while taking prescription pain medicine. Use proper lifting techniques. When you bend and lift, use positions that put less stress on your back: [place] your knees. Keep the load close to your body. Avoid twisting. Exercise regularly as told by your health care provider. Exercising helps your back heal faster and helps prevent back injuries by keeping muscles strong and flexible. Work with a physical therapist to make a safe exercise program, as recommended by your health care provider. Do any exercises as told by your physical therapist. Lifestyle Maintain a healthy weight. Extra weight puts stress on your back and makes it difficult to have good  posture. Avoid activities or situations that make you feel anxious or stressed. Stress and anxiety increase muscle tension and can make back pain worse. Learn ways to manage anxiety and stress, such as through exercise. General instructions Sleep on a firm mattress in a comfortable position. Try lying on your side with your knees slightly bent. If you lie on your back, put a pillow under your knees. Keep your head and neck in a straight line with your spine (neutral position) when using electronic equipment like  smartphones or pads. To do this: Raise your smartphone or pad to look at it instead of bending your head or neck to look down. Put the smartphone or pad at the level of your face while looking at the screen. Follow your treatment plan as told by your health care provider. This may include: Cognitive or behavioral therapy. Acupuncture or massage therapy. Meditation or yoga. Contact a health care provider if: You have pain that is not relieved with rest or medicine. You have increasing pain going down into your legs or buttocks. Your pain does not improve after 2 weeks. You have pain at night. You lose weight without trying. You have a fever or chills. You develop nausea or vomiting. You develop abdominal pain. Get help right away if: You develop new bowel or bladder control problems. You have unusual weakness or numbness in your arms or legs. You feel faint. These symptoms may represent a serious problem that is an emergency. Do not wait to see if the symptoms will go away. Get medical help right away. Call your local emergency services (911 in the [place] Do not drive yourself to the hospital. Summary Acute back pain is sudden and usually short-lived. Use proper lifting techniques. When you bend and lift, use positions that put less stress on your back. Take over-the-counter and prescription medicines only as told by your health care provider, and apply heat or ice as told. This information is not intended to replace advice given to you by your health care provider. Make sure you discuss any questions you have with your health care provider. Document Revised: 01/17/2021 Document Reviewed: 01/17/2021 Elsevier Patient Education  2024 [employer].

## 2024-08-02 DIAGNOSIS — Z135 Encounter for screening for eye and ear disorders: Secondary | ICD-10-CM | POA: Diagnosis not present

## 2024-08-02 DIAGNOSIS — H5203 Hypermetropia, bilateral: Secondary | ICD-10-CM | POA: Diagnosis not present

## 2024-08-02 DIAGNOSIS — H524 Presbyopia: Secondary | ICD-10-CM | POA: Diagnosis not present

## 2024-08-02 DIAGNOSIS — D3132 Benign neoplasm of left choroid: Secondary | ICD-10-CM | POA: Diagnosis not present

## 2024-08-06 ENCOUNTER — Other Ambulatory Visit: Payer: Self-pay | Admitting: Family Medicine

## 2024-08-06 DIAGNOSIS — I10 Essential (primary) hypertension: Secondary | ICD-10-CM

## 2024-08-07 NOTE — Telephone Encounter (Signed)
 Krista Gonzales NTBS for 6 mos FU NO RF sent to mail order pharmacy

## 2024-08-07 NOTE — Telephone Encounter (Signed)
 Pt said she has enough pills to last her for a while and she will call back to schedule appt

## 2024-08-22 ENCOUNTER — Ambulatory Visit (INDEPENDENT_AMBULATORY_CARE_PROVIDER_SITE_OTHER): Payer: Self-pay | Admitting: Family Medicine

## 2024-08-22 ENCOUNTER — Encounter: Payer: Self-pay | Admitting: Family Medicine

## 2024-08-22 VITALS — BP 151/89 | HR 61 | Temp 97.6°F | Ht 66.0 in | Wt 194.2 lb

## 2024-08-22 DIAGNOSIS — R29898 Other symptoms and signs involving the musculoskeletal system: Secondary | ICD-10-CM | POA: Diagnosis not present

## 2024-08-22 DIAGNOSIS — E782 Mixed hyperlipidemia: Secondary | ICD-10-CM

## 2024-08-22 DIAGNOSIS — Z0000 Encounter for general adult medical examination without abnormal findings: Secondary | ICD-10-CM

## 2024-08-22 DIAGNOSIS — Z0001 Encounter for general adult medical examination with abnormal findings: Secondary | ICD-10-CM

## 2024-08-22 DIAGNOSIS — I10 Essential (primary) hypertension: Secondary | ICD-10-CM

## 2024-08-22 DIAGNOSIS — E538 Deficiency of other specified B group vitamins: Secondary | ICD-10-CM | POA: Diagnosis not present

## 2024-08-22 DIAGNOSIS — E559 Vitamin D deficiency, unspecified: Secondary | ICD-10-CM | POA: Diagnosis not present

## 2024-08-22 DIAGNOSIS — I83893 Varicose veins of bilateral lower extremities with other complications: Secondary | ICD-10-CM

## 2024-08-22 MED ORDER — FUROSEMIDE 20 MG PO TABS: 20.0000 mg | ORAL_TABLET | Freq: Every day | ORAL | 1 refills | Status: AC

## 2024-08-22 MED ORDER — LISINOPRIL 40 MG PO TABS: 40.0000 mg | ORAL_TABLET | Freq: Every day | ORAL | 3 refills | Status: AC

## 2024-08-22 NOTE — Progress Notes (Signed)
 Complete physical exam  Patient: Krista Gonzales   DOB: Jun 12, 1951   73 y.o. Female  MRN: [id number]  Subjective:    Chief Complaint  Patient presents with   Annual Exam    Krista Gonzales is a 73 y.o. female who presents today for a complete physical exam. She reports consuming a general diet. The patient does not participate in regular exercise at present. She generally feels well. She reports sleeping well. She does have additional problems to discuss today.    Her blood pressure readings at home are around 140/80 mmHg, while in the office she has been in the 150s/80s and 90s. She is currently taking lisinopril  20 mg daily and furosemide  20 mg daily. She also takes vitamin D3 2000 units weekly and vitamin B12 1000 mcg daily. Despite these medications, she continues to feel tired.  She has experienced leg swelling for many years and has been using compression hose for about 20 years. Her legs feel 'heavy and tired' when she removes the hose, especially as the day progresses. Even when sitting without the hose, her legs feel tired and heavy. She has a history of varicose veins.  No headaches, chest pain, or recent illnesses. She wears hearing aids, obtained about a year ago. She uses Miralax  to maintain regular bowel movements and tries to drink a lot of water. Her recent health maintenance includes an up-to-date mammogram, tetanus, and colonoscopy. She has had a shingles vaccine several years ago but only received one shot. She has not received a flu shot recently.      Most recent fall risk assessment:    08/22/2024    8:41 AM  Fall Risk   Falls in the past year? 0  Risk for fall due to : No Fall Risks  Follow up Falls evaluation completed     Most recent depression screenings:    08/22/2024    8:41 AM 07/21/2024    9:11 AM  PHQ 2/9 Scores  PHQ - 2 Score 0 0  PHQ- 9 Score 0 0    Vision:Within last year and Dental: No current dental problems and Receives regular  dental care  Patient Active Problem List   Diagnosis Date Noted   Mixed hyperlipidemia 08/20/2022   Metabolic syndrome 10/15/2020   Osteoporosis 03/05/2020   OAB (overactive bladder) 02/14/2020   Vitamin D  deficiency 06/08/2019   B12 deficiency 06/08/2019   Peripheral edema 11/19/2015   Obesity (BMI 30.0-34.9) 11/19/2015   Essential hypertension    Past Medical History:  Diagnosis Date   Hypertension    Past Surgical History:  Procedure Laterality Date   ABDOMINAL HYSTERECTOMY     BREAST EXCISIONAL BIOPSY Right    GALLBLADDER SURGERY     HERNIA REPAIR     TUMOR REMOVAL     Social History   Tobacco Use   Smoking status: Never   Smokeless tobacco: Never  Substance Use Topics   Alcohol use: No   Drug use: No   Social History   Socioeconomic History   Marital status: Married    Spouse name: Dwight   Number of children: 2   Years of education: 12   Highest education level: GED or equivalent  Occupational History   Occupation: Retired  Tobacco Use   Smoking status: Never   Smokeless tobacco: Never  Substance and Sexual Activity   Alcohol use: No   Drug use: No   Sexual activity: Not Currently  Other Topics Concern   Not on  file  Social History Narrative   Lives in one level home with her husband.   Family/ children live nearby   Social Drivers of Health   Financial Resource Strain: Patient Declined (08/21/2024)   Overall Financial Resource Strain (CARDIA)    Difficulty of Paying Living Expenses: Patient declined  Food Insecurity: No Food Insecurity (08/21/2024)   Hunger Vital Sign    Worried About Running Out of Food in the Last Year: Never true    Ran Out of Food in the Last Year: Never true  Transportation Needs: No Transportation Needs (08/21/2024)   PRAPARE - Administrator, Civil Service (Medical): No    Lack of Transportation (Non-Medical): No  Physical Activity: Insufficiently Active (08/21/2024)   Exercise Vital Sign    Days of  Exercise per Week: 3 days    Minutes of Exercise per Session: 20 min  Stress: Patient Declined (08/21/2024)   [employer] of Occupational Health - Occupational Stress Questionnaire    Feeling of Stress: Patient declined  Social Connections: Moderately Integrated (08/21/2024)   Social Connection and Isolation Panel    Frequency of Communication with Friends and Family: More than three times a week    Frequency of Social Gatherings with Friends and Family: Twice a week    Attends Religious Services: 1 to 4 times per year    Active Member of [employer] or Organizations: No    Attends Engineer, structural: Not on file    Marital Status: Married  Recent Concern: Social Connections - Moderately Isolated (07/20/2024)   Social Connection and Isolation Panel    Frequency of Communication with Friends and Family: More than three times a week    Frequency of Social Gatherings with Friends and Family: Twice a week    Attends Religious Services: Patient declined    Database administrator or Organizations: No    Attends Engineer, structural: Not on file    Marital Status: Married  Catering manager Violence: Not At Risk (02/10/2023)   Humiliation, Afraid, Rape, and Kick questionnaire    Fear of Current or Ex-Partner: No    Emotionally Abused: No    Physically Abused: No    Sexually Abused: No   Family Status  Relation Name Status   Neg Hx  (Not Specified)  No partnership data on file   Family History  Problem Relation Age of Onset   Breast cancer Neg Hx    No Known Allergies    Patient Care Team: Ellionna Buckbee, Rock HERO, FNP as PCP - General (Family Medicine)   Outpatient Medications Prior to Visit  Medication Sig   Cholecalciferol  (VITAMIN D ) 50 MCG (2000 UT) CAPS Take by mouth.   cyanocobalamin  (VITAMIN B12) 1000 MCG tablet Take 1,000 mcg by mouth daily.   Multiple Vitamins-Minerals (MULTIVITAMIN WITH MINERALS) tablet Take 1 tablet by mouth daily.    polyethylene glycol  (MIRALAX  / GLYCOLAX ) 17 g packet Take 17 g by mouth daily.   [DISCONTINUED] lisinopril  (ZESTRIL ) 20 MG tablet TAKE 1 TABLET EVERY DAY   [DISCONTINUED] baclofen  (LIORESAL ) 10 MG tablet Take 1 tablet (10 mg total) by mouth 3 (three) times daily.   [DISCONTINUED] diclofenac  (VOLTAREN ) 75 MG EC tablet Take 1 tablet (75 mg total) by mouth 2 (two) times daily.   [DISCONTINUED] furosemide  (LASIX ) 20 MG tablet Take 1 tablet (20 mg total) by mouth daily.   No facility-administered medications prior to visit.    ROS per HPI  Objective:     BP (!) 151/89   Pulse 61   Temp 97.6 F (36.4 C)   Ht 5' 6 (1.676 m)   Wt 194 lb 3.2 oz (88.1 kg)   SpO2 97%   BMI 31.34 kg/m  BP Readings from Last 3 Encounters:  08/22/24 (!) 151/89  07/21/24 130/80  02/02/24 126/79   Wt Readings from Last 3 Encounters:  08/22/24 194 lb 3.2 oz (88.1 kg)  07/21/24 186 lb 9.6 oz (84.6 kg)  02/02/24 186 lb 3.2 oz (84.5 kg)   SpO2 Readings from Last 3 Encounters:  08/22/24 97%  07/21/24 99%  02/02/24 98%      Physical Exam Vitals and nursing note reviewed.  Constitutional:      General: She is not in acute distress.    Appearance: Normal appearance. She is well-developed and well-groomed. She is obese. She is not ill-appearing, toxic-appearing or diaphoretic.  HENT:     Head: Normocephalic and atraumatic.     Jaw: There is normal jaw occlusion.     Right Ear: Hearing normal.     Left Ear: Hearing normal.     Ears:     Comments: Bilateral hearing aids    Nose: Nose normal.     Mouth/Throat:     Lips: Pink.     Mouth: Mucous membranes are moist.     Pharynx: Oropharynx is clear. Uvula midline.  Eyes:     General: Lids are normal.     Extraocular Movements: Extraocular movements intact.     Conjunctiva/sclera: Conjunctivae normal.     Pupils: Pupils are equal, round, and reactive to light.  Neck:     Thyroid : No thyroid  mass, thyromegaly or thyroid  tenderness.     Vascular: No carotid bruit  or JVD.     Trachea: Trachea and phonation normal.  Cardiovascular:     Rate and Rhythm: Normal rate and regular rhythm.     Chest Wall: PMI is not displaced.     Pulses: Normal pulses.     Heart sounds: Normal heart sounds. No murmur heard.    No friction rub. No gallop.  Pulmonary:     Effort: Pulmonary effort is normal. No respiratory distress.     Breath sounds: Normal breath sounds. No wheezing.  Abdominal:     General: Bowel sounds are normal. There is no distension or abdominal bruit.     Palpations: Abdomen is soft. There is no hepatomegaly or splenomegaly.     Tenderness: There is no abdominal tenderness. There is no right CVA tenderness or left CVA tenderness.     Hernia: No hernia is present.  Musculoskeletal:        General: Normal range of motion.     Cervical back: Normal range of motion and neck supple.     Right lower leg: No edema.     Left lower leg: No edema.  Lymphadenopathy:     Cervical: No cervical adenopathy.  Skin:    General: Skin is warm and dry.     Capillary Refill: Capillary refill takes less than 2 seconds.     Coloration: Skin is not cyanotic, jaundiced or pale.     Findings: No rash.  Neurological:     General: No focal deficit present.     Mental Status: She is alert and oriented to person, place, and time.     Sensory: Sensation is intact.     Motor: Motor function is intact.     Coordination: Coordination  is intact.     Gait: Gait is intact.     Deep Tendon Reflexes: Reflexes are normal and symmetric.  Psychiatric:        Attention and Perception: Attention and perception normal.        Mood and Affect: Mood and affect normal.        Speech: Speech normal.        Behavior: Behavior normal. Behavior is cooperative.        Thought Content: Thought content normal.        Cognition and Memory: Cognition and memory normal.        Judgment: Judgment normal.       Last CBC Lab Results  Component Value Date   WBC 5.2 02/02/2024   HGB  13.5 02/02/2024   HCT 41.7 02/02/2024   MCV 88 02/02/2024   MCH 28.5 02/02/2024   RDW 14.3 02/02/2024   PLT 244 02/02/2024   Last metabolic panel Lab Results  Component Value Date   GLUCOSE 96 02/02/2024   NA 144 02/02/2024   K 4.2 02/02/2024   CL 107 (H) 02/02/2024   CO2 26 02/02/2024   BUN 16 02/02/2024   CREATININE 0.76 02/02/2024   EGFR 84 02/02/2024   CALCIUM 9.2 02/02/2024   PROT 6.4 02/02/2024   ALBUMIN 4.2 02/02/2024   LABGLOB 2.2 02/02/2024   AGRATIO 2.0 08/17/2022   BILITOT 0.3 02/02/2024   ALKPHOS 94 02/02/2024   AST 12 02/02/2024   ALT 12 02/02/2024   Last lipids Lab Results  Component Value Date   CHOL 172 02/02/2024   HDL 49 02/02/2024   LDLCALC 104 (H) 02/02/2024   TRIG 102 02/02/2024   CHOLHDL 3.5 02/02/2024   Last hemoglobin A1c Lab Results  Component Value Date   HGBA1C 5.3 08/17/2022   Last thyroid  functions Lab Results  Component Value Date   TSH 1.210 02/02/2024   T4TOTAL 6.3 02/02/2024   Last vitamin D  Lab Results  Component Value Date   VD25OH 29.1 (L) 02/02/2024   Last vitamin B12 and Folate Lab Results  Component Value Date   VITAMINB12 465 02/02/2024   FOLATE 16.5 02/02/2024        Assessment & Plan:    Routine Health Maintenance and Physical Exam  Immunization History  Administered Date(s) Administered   Fluad Quad(high Dose 65+) 11/01/2019, 09/02/2020, 08/20/2022   Influenza,inj,Quad PF,6+ Mos 11/19/2015, 09/07/2018   Moderna SARS-COV2 Booster Vaccination 11/10/2020   PFIZER(Purple Top)SARS-COV-2 Vaccination 01/10/2020, 01/31/2020   Pneumococcal Conjugate-13 06/08/2019   Pneumococcal Polysaccharide-23 09/02/2020   Tdap 08/22/2013, 06/26/2020    Health Maintenance  Topic Date Due   Medicare Annual Wellness (AWV)  02/10/2024   Zoster Vaccines- Shingrix (1 of 2) 10/20/2024 (Originally 09/18/2003)   Influenza Vaccine  02/06/2025 (Originally 06/09/2024)   DEXA SCAN  08/22/2025 (Originally 07/31/2024)   Mammogram   06/16/2025   DTaP/Tdap/Td (3 - Td or Tdap) 06/26/2030   Colonoscopy  03/03/2032   Pneumococcal Vaccine: 50+ Years  Completed   Hepatitis C Screening  Completed   Meningococcal B Vaccine  Aged Out   COVID-19 Vaccine  Discontinued    Discussed health benefits of physical activity, and encouraged her to engage in regular exercise appropriate for her age and condition.  Problem List Items Addressed This Visit       Cardiovascular and Mediastinum   Essential hypertension   Relevant Medications   furosemide  (LASIX ) 20 MG tablet   lisinopril  (ZESTRIL ) 40 MG tablet   Other Relevant  Orders   CBC with Differential/Platelet   CMP14+EGFR   Lipid panel   TSH   T4, free     Other   Vitamin D  deficiency   Relevant Orders   CMP14+EGFR   Vitamin D , 25-hydroxy   B12 deficiency   Relevant Orders   CBC with Differential/Platelet   Vitamin B12   Mixed hyperlipidemia   Relevant Medications   furosemide  (LASIX ) 20 MG tablet   lisinopril  (ZESTRIL ) 40 MG tablet   Other Relevant Orders   CMP14+EGFR   Lipid panel   Other Visit Diagnoses       Annual physical exam    -  Primary   Relevant Orders   CBC with Differential/Platelet   CMP14+EGFR   Lipid panel     Leg heaviness       Relevant Orders   VAS US  LOWER EXTREMITY VENOUS REFLUX     Leg fatigue       Relevant Orders   VAS US  LOWER EXTREMITY VENOUS REFLUX     Varicose veins of bilateral lower extremities with other complications       Relevant Medications   furosemide  (LASIX ) 20 MG tablet   lisinopril  (ZESTRIL ) 40 MG tablet   Other Relevant Orders   VAS US  LOWER EXTREMITY VENOUS REFLUX         Essential hypertension Blood pressure is elevated, running in the 150s/80s-90s range. Home readings are approximately 140/80s. Current management with lisinopril  20 mg is insufficient to achieve target blood pressure of less than 130/80. - Increase lisinopril  to 40 mg daily. Instruct to take two 20 mg tablets until current supply is  exhausted, then switch to new prescription. - Recheck blood pressure in 3 months to assess response to medication adjustment. - Recheck kidney function, liver function, cholesterol, and thyroid  function with current lab work.  Chronic venous insufficiency of lower extremities Reports leg swelling and heaviness, especially when not wearing compression hose. Symptoms suggest venous insufficiency, possibly due to varicose veins. - Order bilateral lower extremity ultrasound to assess for venous insufficiency. - Advise to continue wearing compression hose. - Instruct not to wear compression hose for at least a week before the ultrasound. - Referral to vascular surgeon if ultrasound indicates significant venous insufficiency.  Mixed hyperlipidemia  Vitamin D  deficiency Currently taking 2000 IU of vitamin D3 daily. - Recheck vitamin D  levels with current lab work.  Vitamin B12 deficiency Currently taking 1000 mcg of vitamin B12 daily. Reports persistent tiredness. - Recheck vitamin B12 levels with current lab work.  Constipation Relies on Miralax  for bowel movements. Reports adequate water intake.  General Health Maintenance DEXA scan is due for bone density assessment. Mammogram and colonoscopy are up to date. Tetanus vaccination is current. Shingles vaccination status needs verification. - Plan to perform DEXA scan at next visit. - Verify shingles vaccination status and administer if needed.       Return in 3 months (on 11/22/2024) for HTN.     Rosaline Bruns, FNP

## 2024-08-22 NOTE — Patient Instructions (Signed)
 Goal BP:  Less than 130/80  Take your medications faithfully as prescribed. Maintain a healthy weight. Get at least 150 minutes of aerobic exercise per week. Minimize salt intake, less than 2000 mg per day. Minimize alcohol intake.  DASH Eating Plan DASH stands for Dietary Approaches to Stop Hypertension. The DASH eating plan is a healthy eating plan that has been shown to reduce high blood pressure (hypertension). Additional health benefits may include reducing the risk of type 2 diabetes mellitus, heart disease, and stroke. The DASH eating plan may also help with weight loss.  WHAT DO I NEED TO KNOW ABOUT THE DASH EATING PLAN? For the DASH eating plan, you will follow these general guidelines: Choose foods with a percent daily value for sodium of less than 5% (as listed on the food label). Use salt-free seasonings or herbs instead of table salt or sea salt. Check with your health care provider or pharmacist before using salt substitutes. Eat lower-sodium products, often labeled as lower sodium or no salt added. Eat fresh foods. Eat more vegetables, fruits, and low-fat dairy products. Choose whole grains. Look for the word whole as the first word in the ingredient list. Choose fish and skinless chicken or [place] more often than red meat. Limit fish, poultry, and meat to 6 oz (170 g) each day. Limit sweets, desserts, sugars, and sugary drinks. Choose heart-healthy fats. Limit cheese to 1 oz (28 g) per day. Eat more home-cooked food and less restaurant, buffet, and fast food. Limit fried foods. Cook foods using methods other than frying. Limit canned vegetables. If you do use them, rinse them well to decrease the sodium. When eating at a restaurant, ask that your food be prepared with less salt, or no salt if possible.  WHAT FOODS CAN I EAT? Seek help from a dietitian for individual calorie needs.  Grains Whole grain or whole wheat bread. [place] rice. Whole grain or whole  wheat pasta. Quinoa, bulgur, and whole grain cereals. Low-sodium cereals. Corn or whole wheat flour tortillas. Whole grain cornbread. Whole grain crackers. Low-sodium crackers.  Vegetables Fresh or frozen vegetables (raw, steamed, roasted, or grilled). Low-sodium or reduced-sodium tomato and vegetable juices. Low-sodium or reduced-sodium tomato sauce and paste. Low-sodium or reduced-sodium canned vegetables.   Fruits All fresh, canned (in natural juice), or frozen fruits.  Meat and Other Protein Products Ground beef (85% or leaner), grass-fed beef, or beef trimmed of fat. Skinless chicken or [place]. Ground chicken or [place]. Pork trimmed of fat. All fish and seafood. Eggs. Dried beans, peas, or lentils. Unsalted nuts and seeds. Unsalted canned beans.  Dairy Low-fat dairy products, such as skim or 1% milk, 2% or reduced-fat cheeses, low-fat ricotta or cottage cheese, or plain low-fat yogurt. Low-sodium or reduced-sodium cheeses.  Fats and Oils Tub margarines without trans fats. Light or reduced-fat mayonnaise and salad dressings (reduced sodium). Avocado. Safflower, olive, or canola oils. Natural peanut or almond butter.  Other Unsalted popcorn and pretzels. The items listed above may not be a complete list of recommended foods or beverages. Contact your dietitian for more options.  WHAT FOODS ARE NOT RECOMMENDED?  Grains White bread. White pasta. White rice. Refined cornbread. Bagels and croissants. Crackers that contain trans fat.  Vegetables Creamed or fried vegetables. Vegetables in a cheese sauce. Regular canned vegetables. Regular canned tomato sauce and paste. Regular tomato and vegetable juices.  Fruits Dried fruits. Canned fruit in light or heavy syrup. Fruit juice.  Meat and Other Protein Products Fatty cuts of meat. Ribs,  chicken wings, bacon, sausage, bologna, salami, chitterlings, fatback, hot dogs, bratwurst, and packaged luncheon meats. Salted nuts and seeds. Canned  beans with salt.  Dairy Whole or 2% milk, cream, half-and-half, and cream cheese. Whole-fat or sweetened yogurt. Full-fat cheeses or blue cheese. Nondairy creamers and whipped toppings. Processed cheese, cheese spreads, or cheese curds.  Condiments Onion and garlic salt, seasoned salt, table salt, and sea salt. Canned and packaged gravies. Worcestershire sauce. Tartar sauce. Barbecue sauce. Teriyaki sauce. Soy sauce, including reduced sodium. Steak sauce. Fish sauce. Oyster sauce. Cocktail sauce. Horseradish. Ketchup and mustard. Meat flavorings and tenderizers. Bouillon cubes. Hot sauce. Tabasco sauce. Marinades. Taco seasonings. Relishes.  Fats and Oils Butter, stick margarine, lard, shortening, ghee, and bacon fat. Coconut, palm kernel, or palm oils. Regular salad dressings.  Other Pickles and olives. Salted popcorn and pretzels.  The items listed above may not be a complete list of foods and beverages to avoid. Contact your dietitian for more information.  WHERE CAN I FIND MORE INFORMATION? National Heart, Lung, and Blood Institute: [email or web address] Document Released: 10/15/2011 Document Revised: 03/12/2014 Document Reviewed: 08/30/2013 [facility] Patient Information 2015 [place], [place]. This information is not intended to replace advice given to you by your health care provider. Make sure you discuss any questions you have with your health care provider.   I think that you would greatly benefit from seeing a nutritionist.  If you are interested, please call Dr. Wonda at [phone] to schedule an appointment.

## 2024-08-23 ENCOUNTER — Ambulatory Visit: Payer: Self-pay | Admitting: Family Medicine

## 2024-08-23 DIAGNOSIS — I872 Venous insufficiency (chronic) (peripheral): Secondary | ICD-10-CM

## 2024-08-23 LAB — CBC WITH DIFFERENTIAL/PLATELET
Basophils Absolute: 0 x10E3/uL (ref 0.0–0.2)
Basos: 1 %
EOS (ABSOLUTE): 0.2 x10E3/uL (ref 0.0–0.4)
Eos: 3 %
Hematocrit: 40.1 % (ref 34.0–46.6)
Hemoglobin: 13.1 g/dL (ref 11.1–15.9)
Immature Grans (Abs): 0 x10E3/uL (ref 0.0–0.1)
Immature Granulocytes: 0 %
Lymphocytes Absolute: 1.3 x10E3/uL (ref 0.7–3.1)
Lymphs: 22 %
MCH: 30.3 pg (ref 26.6–33.0)
MCHC: 32.7 g/dL (ref 31.5–35.7)
MCV: 93 fL (ref 79–97)
Monocytes Absolute: 0.5 x10E3/uL (ref 0.1–0.9)
Monocytes: 9 %
Neutrophils Absolute: 3.9 x10E3/uL (ref 1.4–7.0)
Neutrophils: 65 %
Platelets: 241 x10E3/uL (ref 150–450)
RBC: 4.33 x10E6/uL (ref 3.77–5.28)
RDW: 13.6 % (ref 11.7–15.4)
WBC: 5.9 x10E3/uL (ref 3.4–10.8)

## 2024-08-23 LAB — CMP14+EGFR
ALT: 15 IU/L (ref 0–32)
AST: 16 IU/L (ref 0–40)
Albumin: 4 g/dL (ref 3.9–4.9)
Alkaline Phosphatase: 83 IU/L (ref 49–135)
BUN/Creatinine Ratio: 29 — ABNORMAL HIGH (ref 12–28)
BUN: 22 mg/dL (ref 8–27)
Bilirubin Total: 0.3 mg/dL (ref 0.0–1.2)
CO2: 25 mmol/L (ref 20–29)
Calcium: 9.1 mg/dL (ref 8.7–10.3)
Chloride: 108 mmol/L — ABNORMAL HIGH (ref 96–106)
Creatinine, Ser: 0.77 mg/dL (ref 0.57–1.00)
Globulin, Total: 2.2 g/dL (ref 1.5–4.5)
Glucose: 92 mg/dL (ref 70–99)
Potassium: 4.6 mmol/L (ref 3.5–5.2)
Sodium: 145 mmol/L — ABNORMAL HIGH (ref 134–144)
Total Protein: 6.2 g/dL (ref 6.0–8.5)
eGFR: 83 mL/min/1.73 (ref >59)

## 2024-08-23 LAB — LIPID PANEL
Chol/HDL Ratio: 3.4 ratio (ref 0.0–4.4)
Cholesterol, Total: 181 mg/dL (ref 100–199)
HDL: 54 mg/dL (ref >39)
LDL Chol Calc (NIH): 108 mg/dL — ABNORMAL HIGH (ref 0–99)
Triglycerides: 104 mg/dL (ref 0–149)
VLDL Cholesterol Cal: 19 mg/dL (ref 5–40)

## 2024-08-23 LAB — TSH: TSH: 1.07 u[IU]/mL (ref 0.450–4.500)

## 2024-08-23 LAB — VITAMIN D 25 HYDROXY (VIT D DEFICIENCY, FRACTURES): Vit D, 25-Hydroxy: 32.4 ng/mL (ref 30.0–100.0)

## 2024-08-23 LAB — VITAMIN B12: Vitamin B-12: 881 pg/mL (ref 232–1245)

## 2024-08-23 LAB — T4, FREE: Free T4: 0.9 ng/dL (ref 0.82–1.77)

## 2024-08-29 DIAGNOSIS — D692 Other nonthrombocytopenic purpura: Secondary | ICD-10-CM | POA: Diagnosis not present

## 2024-08-29 DIAGNOSIS — S00411A Abrasion of right ear, initial encounter: Secondary | ICD-10-CM | POA: Diagnosis not present

## 2024-08-29 DIAGNOSIS — D485 Neoplasm of uncertain behavior of skin: Secondary | ICD-10-CM | POA: Diagnosis not present

## 2024-08-29 DIAGNOSIS — L814 Other melanin hyperpigmentation: Secondary | ICD-10-CM | POA: Diagnosis not present

## 2024-08-29 DIAGNOSIS — D235 Other benign neoplasm of skin of trunk: Secondary | ICD-10-CM | POA: Diagnosis not present

## 2024-08-29 DIAGNOSIS — L821 Other seborrheic keratosis: Secondary | ICD-10-CM | POA: Diagnosis not present

## 2024-08-29 DIAGNOSIS — D2372 Other benign neoplasm of skin of left lower limb, including hip: Secondary | ICD-10-CM | POA: Diagnosis not present

## 2024-08-29 DIAGNOSIS — L579 Skin changes due to chronic exposure to nonionizing radiation, unspecified: Secondary | ICD-10-CM | POA: Diagnosis not present

## 2024-08-30 ENCOUNTER — Ambulatory Visit

## 2024-08-30 VITALS — BP 151/89 | HR 61 | Ht 66.0 in | Wt 194.0 lb

## 2024-08-30 DIAGNOSIS — Z0000 Encounter for general adult medical examination without abnormal findings: Secondary | ICD-10-CM | POA: Diagnosis not present

## 2024-08-30 NOTE — Progress Notes (Signed)
 Subjective:   Krista Gonzales is a 73 y.o. who presents for a Medicare Wellness preventive visit.  As a reminder, Annual Wellness Visits don't include a physical exam, and some assessments may be limited, especially if this visit is performed virtually. We may recommend an in-person follow-up visit with your provider if needed.  Visit Complete: Virtual I connected with  Krista Gonzales on 08/30/24 by a audio enabled telemedicine application and verified that I am speaking with the correct person using two identifiers.  Patient Location: Home  Provider Location: Home Office  I discussed the limitations of evaluation and management by telemedicine. The patient expressed understanding and agreed to proceed.  Vital Signs: Because this visit was a virtual/telehealth visit, some criteria may be missing or patient reported. Any vitals not documented were not able to be obtained and vitals that have been documented are patient reported.  VideoDeclined- This patient declined Librarian, academic. Therefore the visit was completed with audio only.  Persons Participating in Visit: Patient.  AWV Questionnaire: Yes: Patient Medicare AWV questionnaire was completed by the patient on 08/28/24; I have confirmed that all information answered by patient is correct and no changes since this date.        Objective:    Today's Vitals   08/30/24 0915  BP: (!) 151/89  Pulse: 61  Weight: 194 lb (88 kg)  Height: 5' 6 (1.676 m)   Body mass index is 31.31 kg/m.     08/30/2024    9:18 AM 02/10/2023    3:36 PM 08/29/2021    4:11 PM 02/20/2020   10:37 AM  Advanced Directives  Does Patient Have a Medical Advance Directive? No No No No  Would patient like information on creating a medical advance directive?  No - Patient declined No - Patient declined Yes (MAU/Ambulatory/Procedural Areas - Information given)    Current Medications (verified) Outpatient Encounter  Medications as of 08/30/2024  Medication Sig   Cholecalciferol  (VITAMIN D ) 50 MCG (2000 UT) CAPS Take by mouth.   cyanocobalamin  (VITAMIN B12) 1000 MCG tablet Take 1,000 mcg by mouth daily.   furosemide  (LASIX ) 20 MG tablet Take 1 tablet (20 mg total) by mouth daily.   lisinopril  (ZESTRIL ) 40 MG tablet Take 1 tablet (40 mg total) by mouth daily.   Multiple Vitamins-Minerals (MULTIVITAMIN WITH MINERALS) tablet Take 1 tablet by mouth daily.    polyethylene glycol (MIRALAX  / GLYCOLAX ) 17 g packet Take 17 g by mouth daily.   No facility-administered encounter medications on file as of 08/30/2024.    Allergies (verified) Patient has no known allergies.   History: Past Medical History:  Diagnosis Date   Hypertension    Past Surgical History:  Procedure Laterality Date   ABDOMINAL HYSTERECTOMY     BREAST EXCISIONAL BIOPSY Right    BREAST SURGERY     CHOLECYSTECTOMY     GALLBLADDER SURGERY     HERNIA REPAIR     TUBAL LIGATION     TUMOR REMOVAL     Family History  Problem Relation Age of Onset   COPD Mother    Cancer Father    Arthritis Brother    Hearing loss Brother    Asthma Brother    Diabetes Brother    Hearing loss Brother    Vision loss Brother    Cancer Daughter    COPD Daughter    Diabetes Brother    Hypertension Brother    Cancer Brother    Cancer Brother  Breast cancer Neg Hx    Social History   Socioeconomic History   Marital status: Married    Spouse name: Krista Gonzales   Number of children: 2   Years of education: 12   Highest education level: GED or equivalent  Occupational History   Occupation: Retired  Tobacco Use   Smoking status: Never   Smokeless tobacco: Never  Substance and Sexual Activity   Alcohol use: No   Drug use: No   Sexual activity: Not Currently  Other Topics Concern   Not on file  Social History Narrative   Lives in one level home with her husband.   Family/ children live nearby   Social Drivers of Health   Financial Resource  Strain: Patient Declined (08/30/2024)   Overall Financial Resource Strain (CARDIA)    Difficulty of Paying Living Expenses: Patient declined  Food Insecurity: No Food Insecurity (08/30/2024)   Hunger Vital Sign    Worried About Running Out of Food in the Last Year: Never true    Ran Out of Food in the Last Year: Never true  Transportation Needs: No Transportation Needs (08/30/2024)   PRAPARE - Administrator, Civil Service (Medical): No    Lack of Transportation (Non-Medical): No  Physical Activity: Insufficiently Active (08/30/2024)   Exercise Vital Sign    Days of Exercise per Week: 3 days    Minutes of Exercise per Session: 20 min  Stress: No Stress Concern Present (08/30/2024)   [employer] of Occupational Health - Occupational Stress Questionnaire    Feeling of Stress: Not at all  Social Connections: Moderately Integrated (08/30/2024)   Social Connection and Isolation Panel    Frequency of Communication with Friends and Family: More than three times a week    Frequency of Social Gatherings with Friends and Family: Twice a week    Attends Religious Services: 1 to 4 times per year    Active Member of [employer] or Organizations: No    Attends Banker Meetings: Never    Marital Status: Married  Recent Concern: Social Connections - Moderately Isolated (07/20/2024)   Social Connection and Isolation Panel    Frequency of Communication with Friends and Family: More than three times a week    Frequency of Social Gatherings with Friends and Family: Twice a week    Attends Religious Services: Patient declined    Database administrator or Organizations: No    Attends Engineer, structural: Not on file    Marital Status: Married    Tobacco Counseling Counseling given: Yes    Clinical Intake:  Pre-visit preparation completed: Yes  Pain : No/denies pain     BMI - recorded: 31.31 Nutritional Status: BMI > 30  Obese Nutritional Risks:  None Diabetes: No  Lab Results  Component Value Date   HGBA1C 5.3 08/17/2022     How often do you need to have someone help you when you read instructions, pamphlets, or other written materials from your doctor or pharmacy?: 1 - Never  Interpreter Needed?: No  Information entered by :: alia t/cma   Activities of Daily Living     08/28/2024    4:24 PM  In your present state of health, do you have any difficulty performing the following activities:  Hearing? 1  Vision? 0  Difficulty concentrating or making decisions? 0  Walking or climbing stairs? 0  Dressing or bathing? 0  Doing errands, shopping? 0  Preparing Food and eating ? N  Using the Toilet? N  In the past six months, have you accidently leaked urine? N  Do you have problems with loss of bowel control? N  Managing your Medications? N  Managing your Finances? N  Housekeeping or managing your Housekeeping? N    Patient Care Team: Severa Rock HERO, FNP as PCP - General (Family Medicine)  I have updated your Care Teams any recent Medical Services you may have received from other providers in the past year.     Assessment:   This is a routine wellness examination for [facility].  Hearing/Vision screen Hearing Screening - Comments:: Pt wear hearing aids Vision Screening - Comments:: Pt wear glasses/pt goes to [facility] in [place]/last ov 1 mo ago   Goals Addressed   None    Depression Screen     08/30/2024    9:19 AM 08/22/2024    8:41 AM 07/21/2024    9:11 AM 02/02/2024    7:59 AM 08/12/2023    3:12 PM 02/10/2023    3:35 PM 08/20/2022    8:54 AM  PHQ 2/9 Scores  PHQ - 2 Score 0 0 0 0 0 0 0  PHQ- 9 Score 0 0 0 0 0  0    Fall Risk     08/28/2024    4:24 PM 08/22/2024    8:41 AM 07/21/2024    9:11 AM 02/02/2024    7:59 AM 08/12/2023    3:10 PM  Fall Risk   Falls in the past year? 0 0 0 0 0  Number falls in past yr: 0  0    Injury with Fall? 0  0    Risk for fall due to :  No Fall Risks No Fall Risks No  Fall Risks   Follow up Falls evaluation completed;Education provided Falls evaluation completed Falls evaluation completed Falls evaluation completed     MEDICARE RISK AT HOME:  Medicare Risk at Home Any stairs in or around the home?: (Patient-Rptd) Yes If so, are there any without handrails?: (Patient-Rptd) No Home free of loose throw rugs in walkways, pet beds, electrical cords, etc?: (Patient-Rptd) Yes Adequate lighting in your home to reduce risk of falls?: (Patient-Rptd) Yes Life alert?: (Patient-Rptd) No Use of a cane, walker or w/c?: (Patient-Rptd) No Grab bars in the bathroom?: (Patient-Rptd) No Shower chair or bench in shower?: (Patient-Rptd) No Elevated toilet seat or a handicapped toilet?: (Patient-Rptd) No  TIMED UP AND GO:  Was the test performed?  no  Cognitive Function: 6CIT completed        08/30/2024    9:19 AM 02/10/2023    3:36 PM 02/20/2020   10:38 AM  6CIT Screen  What Year? 0 points 0 points 0 points  What month? 0 points 0 points 0 points  What time? 0 points 0 points 0 points  Count back from 20 0 points 0 points 0 points  Months in reverse 0 points 0 points 0 points  Repeat phrase 0 points 0 points 0 points  Total Score 0 points 0 points 0 points    Immunizations Immunization History  Administered Date(s) Administered   Fluad Quad(high Dose 65+) 11/01/2019, 09/02/2020, 08/20/2022   Influenza,inj,Quad PF,6+ Mos 11/19/2015, 09/07/2018   Moderna SARS-COV2 Booster Vaccination 11/10/2020   PFIZER(Purple Top)SARS-COV-2 Vaccination 01/10/2020, 01/31/2020   Pneumococcal Conjugate-13 06/08/2019   Pneumococcal Polysaccharide-23 09/02/2020   Tdap 08/22/2013, 06/26/2020    Screening Tests Health Maintenance  Topic Date Due   Zoster Vaccines- Shingrix (1 of 2) 10/20/2024 (Originally  09/18/2003)   Influenza Vaccine  02/06/2025 (Originally 06/09/2024)   DEXA SCAN  08/22/2025 (Originally 07/31/2024)   Mammogram  06/16/2025   Medicare Annual Wellness (AWV)   08/30/2025   DTaP/Tdap/Td (3 - Td or Tdap) 06/26/2030   Colonoscopy  03/03/2032   Pneumococcal Vaccine: 50+ Years  Completed   Hepatitis C Screening  Completed   Meningococcal B Vaccine  Aged Out   COVID-19 Vaccine  Discontinued    Health Maintenance Items Addressed: See Nurse Notes at the end of this note  Additional Screening:  Vision Screening: Recommended annual ophthalmology exams for early detection of glaucoma and other disorders of the eye. Is the patient up to date with their annual eye exam?  Yes  Who is the provider or what is the name of the office in which the patient attends annual eye exams? [facility] in [facility]  Dental Screening: Recommended annual dental exams for proper oral hygiene  Community Resource Referral / Chronic Care Management: CRR required this visit?  Yes   CCM required this visit?  No   Plan:    I have personally reviewed and noted the following in the patient's chart:   Medical and social history Use of alcohol, tobacco or illicit drugs  Current medications and supplements including opioid prescriptions. Patient is not currently taking opioid prescriptions. Functional ability and status Nutritional status Physical activity Advanced directives List of other physicians Hospitalizations, surgeries, and ER visits in previous 12 months Vitals Screenings to include cognitive, depression, and falls Referrals and appointments  In addition, I have reviewed and discussed with patient certain preventive protocols, quality metrics, and best practice recommendations. A written personalized care plan for preventive services as well as general preventive health recommendations were provided to patient.   Ozie Ned, CMA   08/30/2024   After Visit Summary: (MyChart) Due to this being a telephonic visit, the after visit summary with patients personalized plan was offered to patient via MyChart   Notes: Nothing significant to report at this  time.

## 2024-08-30 NOTE — Patient Instructions (Signed)
 [name],  Thank you for taking the time for your Medicare Wellness Visit. I appreciate your continued commitment to your health goals. Please review the care plan we discussed, and feel free to reach out if I can assist you further.  Medicare recommends these wellness visits once per year to help you and your care team stay ahead of potential health issues. These visits are designed to focus on prevention, allowing your provider to concentrate on managing your acute and chronic conditions during your regular appointments.  Please note that Annual Wellness Visits do not include a physical exam. Some assessments may be limited, especially if the visit was conducted virtually. If needed, we may recommend a separate in-person follow-up with your provider.  Ongoing Care Seeing your primary care provider every 3 to 6 months helps us  monitor your health and provide consistent, personalized care.   Referrals If a referral was made during today's visit and you haven't received any updates within two weeks, please contact the referred provider directly to check on the status.  Recommended Screenings:  Health Maintenance  Topic Date Due   Medicare Annual Wellness Visit  02/10/2024   Zoster (Shingles) Vaccine (1 of 2) 10/20/2024*   Flu Shot  02/06/2025*   DEXA scan (bone density measurement)  08/22/2025*   Breast Cancer Screening  06/16/2025   DTaP/Tdap/Td vaccine (3 - Td or Tdap) 06/26/2030   Colon Cancer Screening  03/03/2032   Pneumococcal Vaccine for age over 31  Completed   Hepatitis C Screening  Completed   Meningitis B Vaccine  Aged Out   COVID-19 Vaccine  Discontinued  *Topic was postponed. The date shown is not the original due date.       08/30/2024    9:18 AM  Advanced Directives  Does Patient Have a Medical Advance Directive? No   Advance Care Planning is important because it: Ensures you receive medical care that aligns with your values, goals, and preferences. Provides  guidance to your family and loved ones, reducing the emotional burden of decision-making during critical moments.  Vision: Annual vision screenings are recommended for early detection of glaucoma, cataracts, and diabetic retinopathy. These exams can also reveal signs of chronic conditions such as diabetes and high blood pressure.  Dental: Annual dental screenings help detect early signs of oral cancer, gum disease, and other conditions linked to overall health, including heart disease and diabetes.  Please see the attached documents for additional preventive care recommendations.

## 2024-09-06 ENCOUNTER — Ambulatory Visit: Attending: Family Medicine

## 2024-09-06 DIAGNOSIS — I83893 Varicose veins of bilateral lower extremities with other complications: Secondary | ICD-10-CM | POA: Diagnosis not present

## 2024-09-06 DIAGNOSIS — R29898 Other symptoms and signs involving the musculoskeletal system: Secondary | ICD-10-CM

## 2024-10-04 ENCOUNTER — Encounter: Admitting: Family Medicine

## 2024-10-26 ENCOUNTER — Telehealth: Payer: Self-pay | Admitting: Family Medicine

## 2024-10-26 DIAGNOSIS — E7800 Pure hypercholesterolemia, unspecified: Secondary | ICD-10-CM

## 2024-10-26 NOTE — Telephone Encounter (Signed)
 PT MADE APPT IN JANUARY FOR LABS. SHE NEEDS ORDERS PUT IN. PT HAS APPT WITH MICHELLE THE NEXT WEEK

## 2024-10-26 NOTE — Telephone Encounter (Signed)
 Future lab order has been placed to recheck cholesterol levels.

## 2024-11-23 ENCOUNTER — Other Ambulatory Visit

## 2024-11-24 ENCOUNTER — Other Ambulatory Visit

## 2024-11-24 ENCOUNTER — Ambulatory Visit: Payer: Self-pay | Admitting: Family Medicine

## 2024-11-24 DIAGNOSIS — E7800 Pure hypercholesterolemia, unspecified: Secondary | ICD-10-CM

## 2024-11-25 LAB — LIPID PANEL
Chol/HDL Ratio: 3.3 ratio (ref 0.0–4.4)
Cholesterol, Total: 172 mg/dL (ref 100–199)
HDL: 52 mg/dL
LDL Chol Calc (NIH): 102 mg/dL — ABNORMAL HIGH (ref 0–99)
Triglycerides: 101 mg/dL (ref 0–149)
VLDL Cholesterol Cal: 18 mg/dL (ref 5–40)

## 2024-11-26 ENCOUNTER — Ambulatory Visit: Payer: Self-pay | Admitting: Family Medicine

## 2024-12-01 ENCOUNTER — Ambulatory Visit

## 2024-12-01 ENCOUNTER — Encounter: Payer: Self-pay | Admitting: Family Medicine

## 2024-12-01 ENCOUNTER — Ambulatory Visit: Attending: Vascular Surgery | Admitting: Physician Assistant

## 2024-12-01 ENCOUNTER — Other Ambulatory Visit: Payer: Self-pay | Admitting: Family Medicine

## 2024-12-01 ENCOUNTER — Ambulatory Visit: Admitting: Family Medicine

## 2024-12-01 VITALS — BP 123/74 | HR 58 | Temp 98.0°F | Ht 66.0 in | Wt 176.0 lb

## 2024-12-01 VITALS — BP 139/71 | HR 54 | Temp 98.0°F | Ht 66.0 in | Wt 176.0 lb

## 2024-12-01 DIAGNOSIS — Z780 Asymptomatic menopausal state: Secondary | ICD-10-CM | POA: Diagnosis not present

## 2024-12-01 DIAGNOSIS — I872 Venous insufficiency (chronic) (peripheral): Secondary | ICD-10-CM

## 2024-12-01 DIAGNOSIS — Z13820 Encounter for screening for osteoporosis: Secondary | ICD-10-CM

## 2024-12-01 DIAGNOSIS — E782 Mixed hyperlipidemia: Secondary | ICD-10-CM | POA: Diagnosis not present

## 2024-12-01 DIAGNOSIS — I10 Essential (primary) hypertension: Secondary | ICD-10-CM

## 2024-12-01 DIAGNOSIS — M810 Age-related osteoporosis without current pathological fracture: Secondary | ICD-10-CM | POA: Diagnosis not present

## 2024-12-01 DIAGNOSIS — Z20828 Contact with and (suspected) exposure to other viral communicable diseases: Secondary | ICD-10-CM

## 2024-12-01 MED ORDER — OSELTAMIVIR PHOSPHATE 75 MG PO CAPS: 75.0000 mg | ORAL_CAPSULE | Freq: Two times a day (BID) | ORAL | 0 refills | Status: AC

## 2024-12-01 NOTE — Progress Notes (Signed)
 "                Office Note   History of Present Illness   Krista Gonzales is a 74 y.o. (10/01/1951) female who presents for evaluation of venous insufficiency.  The patient states that she has had bilateral lower extremity swelling for over a decade now.  She notes that her lower legs and ankles will get swollen if she is on her feet a lot or sitting for a long period of time.  This causes her legs to feel generally achy and heavy.  Her symptoms are fairly well-controlled by wearing knee-high compression stockings daily.  She does not regularly elevate her legs above her heart.  She notes that she has had spider veins scattered across both of her legs for several years, however these do not bother her.  She denies any prior vein procedures.  She has no prior history of DVT.  Past Medical History:  Diagnosis Date   Hypertension     Past Surgical History:  Procedure Laterality Date   ABDOMINAL HYSTERECTOMY     BREAST EXCISIONAL BIOPSY Right    BREAST SURGERY     CHOLECYSTECTOMY     GALLBLADDER SURGERY     HERNIA REPAIR     TUBAL LIGATION     TUMOR REMOVAL      Social History   Socioeconomic History   Marital status: Married    Spouse name: Dwight   Number of children: 2   Years of education: 12   Highest education level: GED or equivalent  Occupational History   Occupation: Retired  Tobacco Use   Smoking status: Never   Smokeless tobacco: Never  Substance and Sexual Activity   Alcohol use: No   Drug use: No   Sexual activity: Not Currently  Other Topics Concern   Not on file  Social History Narrative   Lives in one level home with her husband.   Family/ children live nearby   Social Drivers of Health   Tobacco Use: Low Risk (12/01/2024)   Patient History    Smoking Tobacco Use: Never    Smokeless Tobacco Use: Never    Passive Exposure: Not on file  Financial Resource Strain: Patient Declined (08/30/2024)   Overall Financial Resource Strain (CARDIA)     Difficulty of Paying Living Expenses: Patient declined  Food Insecurity: No Food Insecurity (08/30/2024)   Epic    Worried About Programme Researcher, Broadcasting/film/video in the Last Year: Never true    Ran Out of Food in the Last Year: Never true  Transportation Needs: No Transportation Needs (08/30/2024)   Epic    Lack of Transportation (Medical): No    Lack of Transportation (Non-Medical): No  Physical Activity: Insufficiently Active (08/30/2024)   Exercise Vital Sign    Days of Exercise per Week: 3 days    Minutes of Exercise per Session: 20 min  Stress: No Stress Concern Present (08/30/2024)   [employer] of Occupational Health - Occupational Stress Questionnaire    Feeling of Stress: Not at all  Social Connections: Moderately Integrated (08/30/2024)   Social Connection and Isolation Panel    Frequency of Communication with Friends and Family: More than three times a week    Frequency of Social Gatherings with Friends and Family: Twice a week    Attends Religious Services: 1 to 4 times per year    Active Member of [employer] or Organizations: No    Attends Banker Meetings: Never  Marital Status: Married  Recent Concern: Social Connections - Moderately Isolated (07/20/2024)   Social Connection and Isolation Panel    Frequency of Communication with Friends and Family: More than three times a week    Frequency of Social Gatherings with Friends and Family: Twice a week    Attends Religious Services: Patient declined    Active Member of Clubs or Organizations: No    Attends Engineer, Structural: Not on file    Marital Status: Married  Catering Manager Violence: Not At Risk (08/30/2024)   Epic    Fear of Current or Ex-Partner: No    Emotionally Abused: No    Physically Abused: No    Sexually Abused: No  Depression (PHQ2-9): Low Risk (08/30/2024)   Depression (PHQ2-9)    PHQ-2 Score: 0  Alcohol Screen: Low Risk (08/30/2024)   Alcohol Screen    Last Alcohol Screening Score  (AUDIT): 0  Housing: Unknown (08/30/2024)   Epic    Unable to Pay for Housing in the Last Year: Patient declined    Number of Times Moved in the Last Year: Not on file    Homeless in the Last Year: No  Utilities: Not At Risk (08/30/2024)   Epic    Threatened with loss of utilities: No  Health Literacy: Adequate Health Literacy (08/30/2024)   B1300 Health Literacy    Frequency of need for help with medical instructions: Never    Family History  Problem Relation Age of Onset   COPD Mother    Cancer Father    Arthritis Brother    Hearing loss Brother    Asthma Brother    Diabetes Brother    Hearing loss Brother    Vision loss Brother    Cancer Daughter    COPD Daughter    Diabetes Brother    Hypertension Brother    Cancer Brother    Cancer Brother    Breast cancer Neg Hx     Current Outpatient Medications  Medication Sig Dispense Refill   Cholecalciferol  (VITAMIN D ) 50 MCG (2000 UT) CAPS Take by mouth.     cyanocobalamin  (VITAMIN B12) 1000 MCG tablet Take 1,000 mcg by mouth daily.     furosemide  (LASIX ) 20 MG tablet Take 1 tablet (20 mg total) by mouth daily. 90 tablet 1   lisinopril  (ZESTRIL ) 40 MG tablet Take 1 tablet (40 mg total) by mouth daily. 90 tablet 3   Multiple Vitamins-Minerals (MULTIVITAMIN WITH MINERALS) tablet Take 1 tablet by mouth daily.      polyethylene glycol (MIRALAX  / GLYCOLAX ) 17 g packet Take 17 g by mouth daily. 90 each 3   No current facility-administered medications for this visit.    Allergies[1]  REVIEW OF SYSTEMS (negative unless checked):   Cardiac:  []  Chest pain or chest pressure? []  Shortness of breath upon activity? []  Shortness of breath when lying flat? []  Irregular heart rhythm?  Vascular:  []  Pain in calf, thigh, or hip brought on by walking? []  Pain in feet at night that wakes you up from your sleep? []  Blood clot in your veins? [x]  Leg swelling?  Pulmonary:  []  Oxygen at home? []  Productive cough? []   Wheezing?  Neurologic:  []  Sudden weakness in arms or legs? []  Sudden numbness in arms or legs? []  Sudden onset of difficult speaking or slurred speech? []  Temporary loss of vision in one eye? []  Problems with dizziness?  Gastrointestinal:  []  Blood in stool? []  Vomited blood?  Genitourinary:  []  Burning  when urinating? []  Blood in urine?  Psychiatric:  []  Major depression  Hematologic:  []  Bleeding problems? []  Problems with blood clotting?  Dermatologic:  []  Rashes or ulcers?  Constitutional:  []  Fever or chills?  Ear/Nose/Throat:  []  Change in hearing? []  Nose bleeds? []  Sore throat?  Musculoskeletal:  []  Back pain? []  Joint pain? []  Muscle pain?   Physical Examination     Vitals:   12/01/24 1117  BP: 123/74  Pulse: (!) 58  Temp: 98 F (36.7 C)  SpO2: 97%  Weight: 176 lb (79.8 kg)  Height: 5' 6 (1.676 m)   Body mass index is 28.41 kg/m.  General:  WDWN in NAD; vital signs documented above Gait: Not observed HENT: WNL, normocephalic Pulmonary: normal non-labored breathing  Cardiac: Regular Abdomen: soft, NT, no masses Skin: without rashes Vascular Exam/Pulses: Palpable DP pulses bilaterally Extremities: without varicose veins, with reticular veins, with  1+ edema, without stasis pigmentation, without lipodermatosclerosis, without ulcers Musculoskeletal: no muscle wasting or atrophy  Neurologic: A&O X 3;  No focal weakness or paresthesias are detected Psychiatric:  The pt has Normal affect.  Non-invasive Vascular Imaging   BLE Venous Insufficiency Duplex (09/06/2024):  Venous reflux in the right common femoral vein and greater saphenous vein at the proximal calf.  Venous reflux in the left common femoral vein, femoral vein, and greater saphenous vein at the mid calf.  No evidence of DVT or SVT.   Medical Decision Making   Cherita Janet Goerke is a 74 y.o. female who presents for evaluation of venous insufficiency  Based on the patient's  duplex, there is reflux in the right common femoral vein and greater saphenous vein at the proximal calf.  There is also reflux in the left common femoral vein, femoral vein, greater saphenous vein at the mid calf.  There is no evidence of DVT or SVT.  The patient would not be a candidate for saphenous vein ablation in either leg, given that it is competent throughout the thigh. The patient states that she has dealt with bilateral lower leg and ankle swelling for over a decade now.  She has swelling that gets worse with prolonged periods of standing and sitting.  This causes her legs to feel achy and heavy.  She denies any venous ulcerations.  She denies any prior history of DVT or previous vein procedures.  She does wear compression stockings, which helps with her symptoms greatly. On exam she has palpable DP pulses bilaterally.  She has several scattered spider and reticular veins on both of her legs.  She has 1+ edema of bilateral lower legs. I have discussed with the patient that she has evidence of venous insufficiency bilaterally, mostly in the deep system.  She would not be a candidate for saphenous vein ablation.  Her venous insufficiency is likely cause of her leg swelling.  She can continue to treat this with conservative therapy including avoiding prolonged sitting and standing, wearing compression stockings regularly, elevating the legs above the heart regularly, and exercising regularly. She can follow-up with our office as needed   Ahmed SHAUNNA Holster, PA-C [facility] Office: [phone]  12/01/2024, 2:26 PM  Clinic MD: [place]     [1] No Known Allergies  "

## 2024-12-01 NOTE — Progress Notes (Signed)
 "    Subjective:  Patient ID: Krista Gonzales, female    DOB: 07/25/1951, 74 y.o.   MRN: [id number]  Patient Care Team: Severa Rock HERO, FNP as PCP - General (Family Medicine)   Chief Complaint:  Medical Management of Chronic Issues   HPI: [facility] Krista Gonzales is a 74 y.o. female presenting on 12/01/2024 for Medical Management of Chronic Issues   Krista Gonzales is a 74 year old female who presents for a follow-up visit regarding her cardiovascular health and weight management.  She experiences achiness and heaviness in her legs, but swelling is not significant. There is no numbness or tingling in her legs or feet. She continues to wear compression socks and elevates her legs above her heart once or twice daily.  She has lost weight, going from 195 pounds in October to 176 pounds currently, attributed to dietary changes after being informed of high cholesterol levels. She has adopted a Mediterranean diet, avoiding fried foods, processed meats, and red meats, and uses a treadmill daily. Her cholesterol levels have improved, with a total cholesterol decrease from 181 to 172, and her HDL is above 50. Her triglycerides remain low, around 100.  She continues to take Lasix  and lisinopril  without experiencing significant side effects such as headaches, chest pain, leg swelling, shortness of breath, or dry cough. She also takes a multivitamin, vitamin D , and B12 supplements.  She inquires about receiving a flu shot and mentions exposure to the flu through her son. She takes Miralax  daily to manage bowel habits, which are regular as long as she maintains this regimen. No significant headaches, chest pain, leg swelling, shortness of breath, vision changes, or sleep disturbances. Reports regular bowel and bladder habits with daily Miralax  use.          Relevant past medical, surgical, family, and social history reviewed and updated as indicated.  Allergies and medications reviewed and updated.  Data reviewed: Chart in Epic.   Past Medical History:  Diagnosis Date   Hypertension     Past Surgical History:  Procedure Laterality Date   ABDOMINAL HYSTERECTOMY     BREAST EXCISIONAL BIOPSY Right    BREAST SURGERY     CHOLECYSTECTOMY     GALLBLADDER SURGERY     HERNIA REPAIR     TUBAL LIGATION     TUMOR REMOVAL      Social History   Socioeconomic History   Marital status: Married    Spouse name: Dwight   Number of children: 2   Years of education: 12   Highest education level: GED or equivalent  Occupational History   Occupation: Retired  Tobacco Use   Smoking status: Never   Smokeless tobacco: Never  Substance and Sexual Activity   Alcohol use: No   Drug use: No   Sexual activity: Not Currently  Other Topics Concern   Not on file  Social History Narrative   Lives in one level home with her husband.   Family/ children live nearby   Social Drivers of Health   Tobacco Use: Low Risk (12/01/2024)   Patient History    Smoking Tobacco Use: Never    Smokeless Tobacco Use: Never    Passive Exposure: Not on file  Financial Resource Strain: Patient Declined (08/30/2024)   Overall Financial Resource Strain (CARDIA)    Difficulty of Paying Living Expenses: Patient declined  Food Insecurity: No Food Insecurity (08/30/2024)   Epic    Worried About Radiation Protection Practitioner of Food in the Last  Year: Never true    Ran Out of Food in the Last Year: Never true  Transportation Needs: No Transportation Needs (08/30/2024)   Epic    Lack of Transportation (Medical): No    Lack of Transportation (Non-Medical): No  Physical Activity: Insufficiently Active (08/30/2024)   Exercise Vital Sign    Days of Exercise per Week: 3 days    Minutes of Exercise per Session: 20 min  Stress: No Stress Concern Present (08/30/2024)   [employer] of Occupational Health - Occupational Stress Questionnaire    Feeling of Stress: Not at all  Social Connections: Moderately Integrated (08/30/2024)    Social Connection and Isolation Panel    Frequency of Communication with Friends and Family: More than three times a week    Frequency of Social Gatherings with Friends and Family: Twice a week    Attends Religious Services: 1 to 4 times per year    Active Member of [employer] or Organizations: No    Attends Banker Meetings: Never    Marital Status: Married  Recent Concern: Social Connections - Moderately Isolated (07/20/2024)   Social Connection and Isolation Panel    Frequency of Communication with Friends and Family: More than three times a week    Frequency of Social Gatherings with Friends and Family: Twice a week    Attends Religious Services: Patient declined    Active Member of Clubs or Organizations: No    Attends Engineer, Structural: Not on file    Marital Status: Married  Catering Manager Violence: Not At Risk (08/30/2024)   Epic    Fear of Current or Ex-Partner: No    Emotionally Abused: No    Physically Abused: No    Sexually Abused: No  Depression (PHQ2-9): Low Risk (12/01/2024)   Depression (PHQ2-9)    PHQ-2 Score: 0  Alcohol Screen: Low Risk (08/30/2024)   Alcohol Screen    Last Alcohol Screening Score (AUDIT): 0  Housing: Unknown (08/30/2024)   Epic    Unable to Pay for Housing in the Last Year: Patient declined    Number of Times Moved in the Last Year: Not on file    Homeless in the Last Year: No  Utilities: Not At Risk (08/30/2024)   Epic    Threatened with loss of utilities: No  Health Literacy: Adequate Health Literacy (08/30/2024)   B1300 Health Literacy    Frequency of need for help with medical instructions: Never    Outpatient Encounter Medications as of 12/01/2024  Medication Sig   Cholecalciferol  (VITAMIN D ) 50 MCG (2000 UT) CAPS Take by mouth.   cyanocobalamin  (VITAMIN B12) 1000 MCG tablet Take 1,000 mcg by mouth daily.   furosemide  (LASIX ) 20 MG tablet Take 1 tablet (20 mg total) by mouth daily.   lisinopril  (ZESTRIL ) 40 MG  tablet Take 1 tablet (40 mg total) by mouth daily.   Multiple Vitamins-Minerals (MULTIVITAMIN WITH MINERALS) tablet Take 1 tablet by mouth daily.    oseltamivir (TAMIFLU) 75 MG capsule Take 1 capsule (75 mg total) by mouth 2 (two) times daily for 5 days.   polyethylene glycol (MIRALAX  / GLYCOLAX ) 17 g packet Take 17 g by mouth daily.   No facility-administered encounter medications on file as of 12/01/2024.    Allergies[1]  Pertinent ROS per HPI, otherwise unremarkable      Objective:  BP 139/71   Pulse (!) 54   Temp 98 F (36.7 C) (Temporal)   Ht 5' 6 (1.676 m)   Wt  176 lb (79.8 kg)   SpO2 97%   BMI 28.41 kg/m    Wt Readings from Last 3 Encounters:  12/01/24 176 lb (79.8 kg)  12/01/24 176 lb (79.8 kg)  08/30/24 194 lb (88 kg)    Physical Exam Vitals and nursing note reviewed.  Constitutional:      General: She is not in acute distress.    Appearance: Normal appearance. She is well-developed, well-groomed and overweight. She is not ill-appearing, toxic-appearing or diaphoretic.  HENT:     Head: Normocephalic and atraumatic.     Jaw: There is normal jaw occlusion.     Right Ear: Hearing normal.     Left Ear: Hearing normal.     Nose: Nose normal.     Mouth/Throat:     Lips: Pink.     Mouth: Mucous membranes are moist.     Pharynx: Oropharynx is clear. Uvula midline.  Eyes:     General: Lids are normal.     Extraocular Movements: Extraocular movements intact.     Conjunctiva/sclera: Conjunctivae normal.     Pupils: Pupils are equal, round, and reactive to light.  Neck:     Trachea: Trachea and phonation normal.  Cardiovascular:     Rate and Rhythm: Normal rate and regular rhythm.     Chest Wall: PMI is not displaced.     Pulses: Normal pulses.     Heart sounds: Normal heart sounds. No murmur heard.    No friction rub. No gallop.  Pulmonary:     Effort: Pulmonary effort is normal. No respiratory distress.     Breath sounds: Normal breath sounds. No wheezing.   Abdominal:     General: Bowel sounds are normal.     Palpations: Abdomen is soft.  Musculoskeletal:        General: Normal range of motion.     Cervical back: Normal range of motion and neck supple.     Right lower leg: No edema.     Left lower leg: No edema.  Skin:    General: Skin is warm and dry.     Capillary Refill: Capillary refill takes less than 2 seconds.     Coloration: Skin is not cyanotic, jaundiced or pale.     Findings: No rash.  Neurological:     General: No focal deficit present.     Mental Status: She is alert and oriented to person, place, and time.     Sensory: Sensation is intact.     Motor: Motor function is intact.     Coordination: Coordination is intact.     Gait: Gait is intact.     Deep Tendon Reflexes: Reflexes are normal and symmetric.  Psychiatric:        Attention and Perception: Attention and perception normal.        Mood and Affect: Mood and affect normal.        Speech: Speech normal.        Behavior: Behavior normal. Behavior is cooperative.        Thought Content: Thought content normal.        Cognition and Memory: Cognition and memory normal.        Judgment: Judgment normal.       Results for orders placed or performed in visit on 11/24/24  Lipid Panel   Collection Time: 11/24/24  9:22 AM  Result Value Ref Range   Cholesterol, Total 172 100 - 199 mg/dL   Triglycerides 898 0 - 149 mg/dL  HDL 52 >39 mg/dL   VLDL Cholesterol Cal 18 5 - 40 mg/dL   LDL Chol Calc (NIH) 897 (H) 0 - 99 mg/dL   Chol/HDL Ratio 3.3 0.0 - 4.4 ratio       Pertinent labs & imaging results that were available during my care of the patient were reviewed by me and considered in my medical decision making.  Assessment & Plan:  Brionna was seen today for medical management of chronic issues.  Diagnoses and all orders for this visit:  Essential hypertension  Mixed hyperlipidemia  Age-related osteoporosis without current pathological fracture  Exposure to  influenza -     oseltamivir (TAMIFLU) 75 MG capsule; Take 1 capsule (75 mg total) by mouth 2 (two) times daily for 5 days.       Exposure to influenza Recent exposure to influenza due to son's infection.  - Prescribed Tamiflu prophylactically, twice a day for five days.  Essential hypertension Blood pressure is well-controlled with current medication regimen. No reported side effects such as headaches, chest pain, or leg swelling. - Continue current medications: Lasix  and lisinopril .  Mixed hyperlipidemia Cholesterol levels have improved with dietary and lifestyle changes. LDL is slightly elevated at 102, but HDL is above 50, and total cholesterol has decreased from 181 to 172. Triglycerides remain stable at 100. - Continue Mediterranean diet and regular exercise.  Age-related osteoporosis without current pathological fracture Bone density test is scheduled to assess current status of osteoporosis. - Proceed with scheduled DEXA scan.          Continue all other maintenance medications.  Follow up plan: Return if symptoms worsen or fail to improve.   Continue healthy lifestyle choices, including diet (rich in fruits, vegetables, and lean proteins, and low in salt and simple carbohydrates) and exercise (at least 30 minutes of moderate physical activity daily).  Educational handout given for Mediterranean Diet  The above assessment and management plan was discussed with the patient. The patient verbalized understanding of and has agreed to the management plan. Patient is aware to call the clinic if they develop any new symptoms or if symptoms persist or worsen. Patient is aware when to return to the clinic for a follow-up visit. Patient educated on when it is appropriate to go to the emergency department.   Rosaline Bruns, FNP-C [facility] [phone]     [1] No Known Allergies  "

## 2024-12-05 ENCOUNTER — Ambulatory Visit: Payer: Self-pay | Admitting: Family Medicine

## 2024-12-14 ENCOUNTER — Encounter: Admitting: Family Medicine

## 2025-08-24 ENCOUNTER — Encounter: Payer: Self-pay | Admitting: Family Medicine
